# Patient Record
Sex: Male | Born: 1975 | Race: White | Hispanic: No | Marital: Single | Smoking: Current every day smoker
Health system: Southern US, Community
[De-identification: ages and names within clinical notes are randomized; demographics above are authoritative.]

## PROBLEM LIST (undated history)

## (undated) DIAGNOSIS — J189 Pneumonia, unspecified organism: Secondary | ICD-10-CM

## (undated) HISTORY — PX: BRONCHOSCOPIC REMOVAL OF FOREIGN BODY: SHX5171

---

## 2018-04-06 ENCOUNTER — Emergency Department (HOSPITAL_COMMUNITY): Payer: Worker's Compensation

## 2018-04-06 ENCOUNTER — Encounter (HOSPITAL_COMMUNITY): Payer: Self-pay

## 2018-04-06 ENCOUNTER — Encounter (HOSPITAL_COMMUNITY)
Admission: EM | Disposition: A | Discharge status: Home / Self Care | Payer: Self-pay | Source: Home / Self Care | Attending: Orthopaedic Surgery

## 2018-04-06 ENCOUNTER — Emergency Department (HOSPITAL_COMMUNITY): Payer: Worker's Compensation | Admitting: Anesthesiology

## 2018-04-06 ENCOUNTER — Inpatient Hospital Stay (HOSPITAL_COMMUNITY)
Admission: EM | Admit: 2018-04-06 | Discharge: 2018-04-08 | DRG: 494 | Disposition: A | Discharge status: Home / Self Care | Payer: Worker's Compensation | Attending: Orthopaedic Surgery | Admitting: Orthopaedic Surgery

## 2018-04-06 ENCOUNTER — Other Ambulatory Visit: Payer: Self-pay

## 2018-04-06 DIAGNOSIS — Y990 Civilian activity done for income or pay: Secondary | ICD-10-CM | POA: Diagnosis not present

## 2018-04-06 DIAGNOSIS — S82871A Displaced pilon fracture of right tibia, initial encounter for closed fracture: Principal | ICD-10-CM | POA: Diagnosis present

## 2018-04-06 DIAGNOSIS — S82401A Unspecified fracture of shaft of right fibula, initial encounter for closed fracture: Secondary | ICD-10-CM

## 2018-04-06 DIAGNOSIS — Z419 Encounter for procedure for purposes other than remedying health state, unspecified: Secondary | ICD-10-CM

## 2018-04-06 DIAGNOSIS — S89301A Unspecified physeal fracture of lower end of right fibula, initial encounter for closed fracture: Secondary | ICD-10-CM | POA: Diagnosis present

## 2018-04-06 DIAGNOSIS — Y93H3 Activity, building and construction: Secondary | ICD-10-CM

## 2018-04-06 DIAGNOSIS — F17200 Nicotine dependence, unspecified, uncomplicated: Secondary | ICD-10-CM | POA: Diagnosis present

## 2018-04-06 DIAGNOSIS — S82201A Unspecified fracture of shaft of right tibia, initial encounter for closed fracture: Secondary | ICD-10-CM

## 2018-04-06 DIAGNOSIS — S82871S Displaced pilon fracture of right tibia, sequela: Secondary | ICD-10-CM

## 2018-04-06 DIAGNOSIS — T148XXA Other injury of unspecified body region, initial encounter: Secondary | ICD-10-CM

## 2018-04-06 DIAGNOSIS — W12XXXA Fall on and from scaffolding, initial encounter: Secondary | ICD-10-CM | POA: Diagnosis present

## 2018-04-06 DIAGNOSIS — W19XXXA Unspecified fall, initial encounter: Secondary | ICD-10-CM

## 2018-04-06 DIAGNOSIS — Y9269 Other specified industrial and construction area as the place of occurrence of the external cause: Secondary | ICD-10-CM

## 2018-04-06 HISTORY — PX: EXTERNAL FIXATION LEG: SHX1549

## 2018-04-06 LAB — MRSA PCR SCREENING: MRSA by PCR: NEGATIVE

## 2018-04-06 SURGERY — EXTERNAL FIXATION, LOWER EXTREMITY
Anesthesia: General | Laterality: Right

## 2018-04-06 MED ORDER — FENTANYL CITRATE (PF) 100 MCG/2ML IJ SOLN
INTRAMUSCULAR | Status: AC
  Filled 2018-04-06: qty 2

## 2018-04-06 MED ORDER — HYDROMORPHONE HCL 1 MG/ML IJ SOLN
0.5000 mg | INTRAMUSCULAR | Status: DC | PRN
  Administered 2018-04-07 (×3): 1 mg via INTRAVENOUS
  Filled 2018-04-06 (×3): qty 1

## 2018-04-06 MED ORDER — CEFAZOLIN SODIUM-DEXTROSE 1-4 GM/50ML-% IV SOLN
1.0000 g | Freq: Four times a day (QID) | INTRAVENOUS | Status: AC
  Administered 2018-04-07 (×3): 1 g via INTRAVENOUS
  Filled 2018-04-06 (×3): qty 50

## 2018-04-06 MED ORDER — FENTANYL CITRATE (PF) 100 MCG/2ML IJ SOLN
50.0000 ug | Freq: Once | INTRAMUSCULAR | Status: AC
  Administered 2018-04-06: 50 ug via INTRAVENOUS

## 2018-04-06 MED ORDER — SODIUM CHLORIDE 0.9 % IV SOLN
INTRAVENOUS | Status: DC
  Administered 2018-04-06: 23:00:00 via INTRAVENOUS

## 2018-04-06 MED ORDER — PROPOFOL 10 MG/ML IV BOLUS
INTRAVENOUS | Status: DC | PRN
  Administered 2018-04-06: 200 mg via INTRAVENOUS

## 2018-04-06 MED ORDER — FENTANYL CITRATE (PF) 100 MCG/2ML IJ SOLN
INTRAMUSCULAR | Status: DC | PRN
  Administered 2018-04-06: 50 ug via INTRAVENOUS
  Administered 2018-04-06 (×2): 25 ug via INTRAVENOUS

## 2018-04-06 MED ORDER — PROMETHAZINE HCL 25 MG/ML IJ SOLN: 6.2500 mg | INTRAMUSCULAR | Status: DC | PRN

## 2018-04-06 MED ORDER — CEFAZOLIN SODIUM-DEXTROSE 2-4 GM/100ML-% IV SOLN
INTRAVENOUS | Status: AC
  Filled 2018-04-06: qty 100

## 2018-04-06 MED ORDER — ONDANSETRON HCL 4 MG/2ML IJ SOLN
INTRAMUSCULAR | Status: DC | PRN
  Administered 2018-04-06: 4 mg via INTRAVENOUS

## 2018-04-06 MED ORDER — METHOCARBAMOL 500 MG IVPB - SIMPLE MED
500.0000 mg | Freq: Four times a day (QID) | INTRAVENOUS | Status: DC | PRN
  Administered 2018-04-06: 500 mg via INTRAVENOUS
  Filled 2018-04-06: qty 50

## 2018-04-06 MED ORDER — KETAMINE HCL 10 MG/ML IJ SOLN
INTRAMUSCULAR | Status: AC
  Filled 2018-04-06: qty 1

## 2018-04-06 MED ORDER — PANTOPRAZOLE SODIUM 40 MG PO TBEC
40.0000 mg | DELAYED_RELEASE_TABLET | Freq: Every day | ORAL | Status: DC
  Administered 2018-04-07 – 2018-04-08 (×2): 40 mg via ORAL
  Filled 2018-04-06 (×2): qty 1

## 2018-04-06 MED ORDER — ACETAMINOPHEN 325 MG PO TABS
325.0000 mg | ORAL_TABLET | Freq: Four times a day (QID) | ORAL | Status: DC | PRN
  Administered 2018-04-07 – 2018-04-08 (×3): 650 mg via ORAL
  Filled 2018-04-06 (×3): qty 2

## 2018-04-06 MED ORDER — KETOROLAC TROMETHAMINE 15 MG/ML IJ SOLN
15.0000 mg | Freq: Four times a day (QID) | INTRAMUSCULAR | Status: DC
  Administered 2018-04-06 – 2018-04-08 (×7): 15 mg via INTRAVENOUS
  Filled 2018-04-06 (×7): qty 1

## 2018-04-06 MED ORDER — ONDANSETRON HCL 4 MG PO TABS: 4.0000 mg | ORAL_TABLET | Freq: Four times a day (QID) | ORAL | Status: DC | PRN

## 2018-04-06 MED ORDER — MORPHINE SULFATE (PF) 4 MG/ML IV SOLN
4.0000 mg | Freq: Once | INTRAVENOUS | Status: AC
  Administered 2018-04-06: 4 mg via INTRAVENOUS
  Filled 2018-04-06: qty 1

## 2018-04-06 MED ORDER — MIDAZOLAM HCL 2 MG/2ML IJ SOLN
INTRAMUSCULAR | Status: AC
  Filled 2018-04-06: qty 2

## 2018-04-06 MED ORDER — MIDAZOLAM HCL 5 MG/5ML IJ SOLN
INTRAMUSCULAR | Status: DC | PRN
  Administered 2018-04-06: 2 mg via INTRAVENOUS

## 2018-04-06 MED ORDER — OXYCODONE HCL 5 MG PO TABS
5.0000 mg | ORAL_TABLET | ORAL | Status: DC | PRN
  Administered 2018-04-06: 5 mg via ORAL
  Filled 2018-04-06: qty 1

## 2018-04-06 MED ORDER — ONDANSETRON HCL 4 MG/2ML IJ SOLN
4.0000 mg | Freq: Once | INTRAMUSCULAR | Status: DC
  Filled 2018-04-06: qty 2

## 2018-04-06 MED ORDER — OXYCODONE HCL 5 MG PO TABS
10.0000 mg | ORAL_TABLET | ORAL | Status: DC | PRN
  Administered 2018-04-06: 10 mg via ORAL
  Administered 2018-04-07 – 2018-04-08 (×12): 15 mg via ORAL
  Filled 2018-04-06 (×8): qty 3
  Filled 2018-04-06: qty 2
  Filled 2018-04-06 (×4): qty 3

## 2018-04-06 MED ORDER — ONDANSETRON HCL 4 MG/2ML IJ SOLN: 4.0000 mg | Freq: Four times a day (QID) | INTRAMUSCULAR | Status: DC | PRN

## 2018-04-06 MED ORDER — KETAMINE HCL 10 MG/ML IJ SOLN
INTRAMUSCULAR | Status: DC | PRN
  Administered 2018-04-06: 50 mg via INTRAVENOUS

## 2018-04-06 MED ORDER — METOCLOPRAMIDE HCL 5 MG PO TABS: 5.0000 mg | ORAL_TABLET | Freq: Three times a day (TID) | ORAL | Status: DC | PRN

## 2018-04-06 MED ORDER — MEPERIDINE HCL 50 MG/ML IJ SOLN: 6.2500 mg | INTRAMUSCULAR | Status: DC | PRN

## 2018-04-06 MED ORDER — METHOCARBAMOL 500 MG IVPB - SIMPLE MED
INTRAVENOUS | Status: AC
  Filled 2018-04-06: qty 50

## 2018-04-06 MED ORDER — LACTATED RINGERS IV SOLN
INTRAVENOUS | Status: DC | PRN
  Administered 2018-04-06: 20:00:00 via INTRAVENOUS

## 2018-04-06 MED ORDER — FENTANYL CITRATE (PF) 100 MCG/2ML IJ SOLN
50.0000 ug | Freq: Once | INTRAMUSCULAR | Status: DC
  Filled 2018-04-06 (×2): qty 2

## 2018-04-06 MED ORDER — HYDROMORPHONE HCL 1 MG/ML IJ SOLN
INTRAMUSCULAR | Status: AC
  Filled 2018-04-06: qty 1

## 2018-04-06 MED ORDER — METOCLOPRAMIDE HCL 5 MG/ML IJ SOLN: 5.0000 mg | Freq: Three times a day (TID) | INTRAMUSCULAR | Status: DC | PRN

## 2018-04-06 MED ORDER — METHOCARBAMOL 500 MG PO TABS
500.0000 mg | ORAL_TABLET | Freq: Four times a day (QID) | ORAL | Status: DC | PRN
  Administered 2018-04-07 – 2018-04-08 (×5): 500 mg via ORAL
  Filled 2018-04-06 (×5): qty 1

## 2018-04-06 MED ORDER — KETOROLAC TROMETHAMINE 30 MG/ML IJ SOLN
30.0000 mg | Freq: Once | INTRAMUSCULAR | Status: AC | PRN
  Administered 2018-04-06: 30 mg via INTRAVENOUS

## 2018-04-06 MED ORDER — LIDOCAINE 2% (20 MG/ML) 5 ML SYRINGE
INTRAMUSCULAR | Status: DC | PRN
  Administered 2018-04-06: 50 mg via INTRAVENOUS

## 2018-04-06 MED ORDER — KETOROLAC TROMETHAMINE 30 MG/ML IJ SOLN
INTRAMUSCULAR | Status: AC
  Filled 2018-04-06: qty 1

## 2018-04-06 MED ORDER — CEFAZOLIN SODIUM-DEXTROSE 2-3 GM-%(50ML) IV SOLR
INTRAVENOUS | Status: DC | PRN
  Administered 2018-04-06: 2 g via INTRAVENOUS

## 2018-04-06 MED ORDER — DOCUSATE SODIUM 100 MG PO CAPS
100.0000 mg | ORAL_CAPSULE | Freq: Two times a day (BID) | ORAL | Status: DC
  Administered 2018-04-06 – 2018-04-08 (×4): 100 mg via ORAL
  Filled 2018-04-06 (×4): qty 1

## 2018-04-06 MED ORDER — HYDROMORPHONE HCL 1 MG/ML IJ SOLN
0.2500 mg | INTRAMUSCULAR | Status: DC | PRN
  Administered 2018-04-06 (×4): 0.5 mg via INTRAVENOUS

## 2018-04-06 MED ORDER — DIPHENHYDRAMINE HCL 12.5 MG/5ML PO ELIX
12.5000 mg | ORAL_SOLUTION | ORAL | Status: DC | PRN
  Administered 2018-04-07 – 2018-04-08 (×2): 25 mg via ORAL
  Filled 2018-04-06 (×2): qty 10

## 2018-04-06 SURGICAL SUPPLY — 42 items
BAG SPEC THK2 15X12 ZIP CLS (MISCELLANEOUS)
BAG ZIPLOCK 12X15 (MISCELLANEOUS) IMPLANT
BANDAGE ESMARK 6X9 LF (GAUZE/BANDAGES/DRESSINGS) ×1 IMPLANT
BNDG CMPR 9X6 STRL LF SNTH (GAUZE/BANDAGES/DRESSINGS)
BNDG ESMARK 6X9 LF (GAUZE/BANDAGES/DRESSINGS)
BNDG GAUZE ELAST 4 BULKY (GAUZE/BANDAGES/DRESSINGS) ×3 IMPLANT
CAP EXTERNAL F/PIN ×8 IMPLANT
COVER SURGICAL LIGHT HANDLE (MISCELLANEOUS) ×3 IMPLANT
COVER WAND RF STERILE (DRAPES) IMPLANT
CUFF TOURN SGL QUICK 18 (TOURNIQUET CUFF) IMPLANT
CUFF TOURN SGL QUICK 24 (TOURNIQUET CUFF)
CUFF TOURN SGL QUICK 34 (TOURNIQUET CUFF)
CUFF TRNQT CYL 24X4X40X1 (TOURNIQUET CUFF) IMPLANT
CUFF TRNQT CYL 34X4X40X1 (TOURNIQUET CUFF) IMPLANT
DRAIN PENROSE 18X1/2 LTX STRL (DRAIN) ×1 IMPLANT
DRSG PAD ABDOMINAL 8X10 ST (GAUZE/BANDAGES/DRESSINGS) ×2 IMPLANT
DURAPREP 26ML APPLICATOR (WOUND CARE) ×6 IMPLANT
ELECT REM PT RETURN 15FT ADLT (MISCELLANEOUS) ×3 IMPLANT
GAUZE SPONGE 4X4 12PLY STRL (GAUZE/BANDAGES/DRESSINGS) ×1 IMPLANT
GLOVE BIO SURGEON STRL SZ7.5 (GLOVE) ×13 IMPLANT
GLOVE BIOGEL PI IND STRL 8 (GLOVE) ×1 IMPLANT
GLOVE BIOGEL PI INDICATOR 8 (GLOVE) ×2
GLOVE ECLIPSE 8.0 STRL XLNG CF (GLOVE) ×3 IMPLANT
GOWN STRL REUS W/TWL XL LVL3 (GOWN DISPOSABLE) ×7 IMPLANT
HANDPIECE INTERPULSE COAX TIP (DISPOSABLE)
KIT BASIN OR (CUSTOM PROCEDURE TRAY) ×3 IMPLANT
PACK ORTHO EXTREMITY (CUSTOM PROCEDURE TRAY) ×3 IMPLANT
PAD CAST 4YDX4 CTTN HI CHSV (CAST SUPPLIES) ×1 IMPLANT
PADDING CAST COTTON 4X4 STRL (CAST SUPPLIES) ×3
PIN APEX 5.0X200MM (PIN) ×4 IMPLANT
PIN CLAMP 5H 30DEG POST (EXFIX) ×2 IMPLANT
PIN TO ROD COUPLING EXFIX (EXFIX) ×18 IMPLANT
PIN TRANSFIX 615X300 EXFIX (EXFIX) ×2 IMPLANT
PROTECTOR NERVE ULNAR (MISCELLANEOUS) ×3 IMPLANT
ROD CARBON (EXFIX) ×6
ROD CNCT 400X11XNS LF HFMN (EXFIX) ×2 IMPLANT
ROD TIBIA SEMI CRC CVD 11X220 (EXFIX) ×2 IMPLANT
ROD TO ROD COUPLING EXFIX (EXFIX) ×4 IMPLANT
SET HNDPC FAN SPRY TIP SCT (DISPOSABLE) ×1 IMPLANT
SYR CONTROL 10ML LL (SYRINGE) IMPLANT
TOWEL OR 17X26 10 PK STRL BLUE (TOWEL DISPOSABLE) ×4 IMPLANT
stryker trauma ×33 IMPLANT

## 2018-04-06 NOTE — Anesthesia Procedure Notes (Signed)
Procedure Name: LMA Insertion Date/Time: 04/06/2018 8:09 PM Performed by: Paris LoreBlanton, Hargun Spurling M, CRNA Pre-anesthesia Checklist: Patient identified, Emergency Drugs available, Suction available, Patient being monitored and Timeout performed Patient Re-evaluated:Patient Re-evaluated prior to induction Oxygen Delivery Method: Circle system utilized Preoxygenation: Pre-oxygenation with 100% oxygen Induction Type: IV induction Ventilation: Mask ventilation without difficulty LMA: LMA inserted LMA Size: 4.0 Number of attempts: 1 Placement Confirmation: positive ETCO2 and breath sounds checked- equal and bilateral Tube secured with: Tape

## 2018-04-06 NOTE — ED Notes (Signed)
Bed: [id number] Expected date:  Expected time:  Means of arrival:  Comments: EMS-leg injury/fall

## 2018-04-06 NOTE — Transfer of Care (Signed)
Immediate Anesthesia Transfer of Care Note  Patient: Lucas Garrett  Procedure(s) Performed: Procedure(s): EXTERNAL FIXATION RIGHT PILON FRACTURE (Right)  Patient Location: PACU  Anesthesia Type:General  Level of Consciousness:  sedated, patient cooperative and responds to stimulation  Airway & Oxygen Therapy:Patient Spontanous Breathing and Patient connected to face mask oxgen  Post-op Assessment:  Report given to PACU RN and Post -op Vital signs reviewed and stable  Post vital signs:  Reviewed and stable  Last Vitals:  Vitals:   04/06/18 1724 04/06/18 1730  BP: (!) 134/55 (!) 149/85  Pulse: 72 74  Resp: 16   Temp:    SpO2: 97% 96%    Complications: No apparent anesthesia complications

## 2018-04-06 NOTE — H&P (Signed)
Lucas Garrett is an 42 y.o. male.   Chief Complaint: Right lower leg pain and deformity  HPI:  Lucas Garrett is a 42 year old male who had an accidental fall off scaffolding earlier today at approximately 230.  He fell approximately 4.5 feet and landed on his right lower leg.  He denies any loss consciousness dizziness or any other injury.  He has no known medical problems.  Does smoke tobacco.  Last meal was 9 AM. No past medical history on file.   No family history on file. Social History:  has no history on file for tobacco, alcohol, and drug.  Allergies: No Known Allergies    No results found for this or any previous visit (from the past 48 hour(s)). Dg Tibia/fibula Right  Result Date: 04/06/2018 CLINICAL DATA:  Fall, trauma EXAM: RIGHT TIBIA AND FIBULA - 2 VIEW COMPARISON:  None. FINDINGS: artifact about the knee from the overlying sheet is present on AP projection. On lateral projection no evidence of fracture of the proximal tibia. Complex comminuted intra-articular fracture of the distal tibia and fibula is partially imaged. See ankle films. IMPRESSION: 1. No evidence of proximal tibial fracture on suboptimal exam. 2. Complex distal tibiofibular fracture described on ankle film. Electronically Signed   By: Genevive BiStewart  Edmunds M.D.   On: 04/06/2018 17:11   Dg Ankle Complete Right  Result Date: 04/06/2018 CLINICAL DATA:  Larey SeatFell at work, knew he was going to fall so jumped from scaffolding, caught foot, injury to foot/ankle, pain, initial encounter EXAM: RIGHT ANKLE - COMPLETE 3+ VIEW COMPARISON:  None FINDINGS: Osseous mineralization normal for technique. Oblique fracture distal RIGHT fibular diaphysis, minimally displaced laterally with mild apex lateral angulation and minimal overriding. Comminuted distal RIGHT tibial metadiaphyseal fracture with extension into tibiotalar joint. Large displaced posterior articular fragment. Medial displacement of a dominant medial articular fragment. Mild  apex medial and anterior angulation at fracture. No gross dislocation. Tarsals appear intact and normally aligned. IMPRESSION: Oblique displaced and minimally angulated distal RIGHT fibular diaphyseal fracture. Comminuted displaced intra-articular distal RIGHT tibial metadiaphyseal fracture. Electronically Signed   By: Ulyses SouthwardMark  Boles M.D.   On: 04/06/2018 17:12   Dg Foot Complete Right  Result Date: 04/06/2018 CLINICAL DATA:  Larey SeatFell at work, knew he was going to fall so jumped from scaffolding, caught foot, injury to foot/ankle, pain, initial encounter EXAM: RIGHT FOOT COMPLETE - 3+ VIEW COMPARISON:  None FINDINGS: Complex sacral fractures as reported separately. [place] shin normal. Joint spaces within the foot preserved. No additional fracture, dislocation, or bone destruction. IMPRESSION: Complex ankle fractures as reported separately. No additional foot abnormalities. Electronically Signed   By: Ulyses SouthwardMark  Boles M.D.   On: 04/06/2018 17:33    Review of Systems  Constitutional: Negative for chills and fever.  Respiratory: Negative for shortness of breath.   Cardiovascular: Negative for chest pain.  Gastrointestinal: Positive for heartburn.  Musculoskeletal: Positive for myalgias.  Neurological: Negative for dizziness and loss of consciousness.    Blood pressure (!) 149/85, pulse 74, temperature (!) 97.4 F (36.3 C), resp. rate 16, SpO2 96 %. Physical Exam  Constitutional: He is oriented to person, place, and time. He appears well-developed and well-nourished. No distress.  HENT:  Head: Normocephalic and atraumatic.  Cardiovascular: Intact distal pulses.  Respiratory: Effort normal.  Neurological: He is alert and oriented to person, place, and time.  Skin: He is not diaphoretic.  Psychiatric: He has a normal mood and affect. His behavior is normal.  Bilateral lower:  Non  tender bilateral hips.Left lower leg non tender throughout. Calves supple and non tender. Dorsal pedal pulses intact  bilaterally. Gross deformity and edema right ankle. Right foot non tender.No rashes or  lacerations right ankle.   Assessment/Plan: Right pilon fracture : Plan closed reduction and application of external fixator. Will require CT scan to further evaluate Pilon fracture and then at a later date will require definitive surgery.  Discussed with the family and the patient that definitive surgery would be likely somewhere around 2 weeks post injury.  Discussed with patient at length that he needs to stop or at least cut back on smoking, due to the fact that this directly inhibits bone healing and also wound healing. N.p.o. Nonweightbearing right lower extremity   Kellen Hover, PA-C 04/06/2018, 6:23 PM

## 2018-04-06 NOTE — ED Provider Notes (Signed)
[facility]-EMERGENCY DEPT Provider Note   CSN: [id number] Arrival date & time: 04/06/18  1446     History   Chief Complaint Chief Complaint  Patient presents with  . Fall  . Leg Pain    HPI Lucas Garrett is a 42 y.o. otherwise healthy male who is a tobacco user, who presents to the ED with complaints of mechanical fall with R leg/ankle injury.  Pt states he was standing up on some scaffolding about 4.51ft up when he felt himself falling so he decided to jump off the scaffolding and landed on his R leg, which he thinks he broke.  He denies hitting his head or LOC, denies prodromal lightheadedness/dizziness leading to the fall.  He describes his pain as 10/10 constant pressure/tingling in the R leg from the knee down to the foot, worse with movement, and with no tx tried PTA.  This incident occurred just PTA.  He denies sustaining any other injuries during the incident, denies hip/back/neck pain, denies pain in any other extremities, and denies CP, SOB, lightheadedness, head inj/LOC, abd pain, n/v, numbness, focal weakness, or any other complaints at this time.  He has no known allergies and denies having any medical problems. He had a sip of water right after this incident happened, but otherwise has been NPO since 9am. Does not have an orthopedist that he sees.   The history is provided by the patient and medical records. No language interpreter was used.  Fall  Pertinent negatives include no chest pain, no abdominal pain and no shortness of breath.  Leg Pain   Pertinent negatives include no numbness.    No past medical history on file.  There are no active problems to display for this patient.       Home Medications    Prior to Admission medications   Not on File    Family History No family history on file.  Social History Social History   Tobacco Use  . Smoking status: Not on file  Substance Use Topics  . Alcohol use: Not on file  . Drug  use: Not on file     Allergies   Patient has no known allergies.   Review of Systems Review of Systems  HENT: Negative for facial swelling (no head inj).   Respiratory: Negative for shortness of breath.   Cardiovascular: Negative for chest pain.  Gastrointestinal: Negative for abdominal pain, nausea and vomiting.  Musculoskeletal: Positive for arthralgias and myalgias. Negative for back pain and neck pain.  Allergic/Immunologic: Negative for immunocompromised state.  Neurological: Negative for dizziness, syncope, weakness, light-headedness and numbness.  Psychiatric/Behavioral: Negative for confusion.   All other systems reviewed and are negative for acute change except as noted in the HPI.    Physical Exam Updated Vital Signs BP 139/63   Pulse 72   Temp (!) 97.4 F (36.3 C)   Resp 16   SpO2 98%   Physical Exam Vitals signs and nursing note reviewed.  Constitutional:      General: He is in acute distress (in pain).     Appearance: Normal appearance. He is well-developed. He is not toxic-appearing.     Comments: Afebrile, nontoxic, pt in pain, laying on his left side, not wanting to roll onto his right side due to pain in his right leg. Semi-uncooperative with exam  HENT:     Head: Normocephalic and atraumatic.  Eyes:     General:        Right eye:  No discharge.        Left eye: No discharge.     Conjunctiva/sclera: Conjunctivae normal.  Neck:     Musculoskeletal: Normal range of motion and neck supple. Normal range of motion. No neck rigidity, pain with movement, spinous process tenderness or muscular tenderness.     Comments: FROM intact without spinous process TTP, no bony stepoffs or deformities, no paraspinous muscle TTP or muscle spasms. No rigidity. No bruising or swelling.  Cardiovascular:     Rate and Rhythm: Normal rate.  Pulmonary:     Effort: Pulmonary effort is normal. No respiratory distress.  Abdominal:     General: There is no distension.    Musculoskeletal:     Right hip: Normal.     Right knee: He exhibits decreased range of motion (due to pain in lower leg). No tenderness found.     Right ankle: He exhibits decreased range of motion and swelling. He exhibits no laceration and normal pulse. Tenderness.     Lumbar back: Normal.     Right lower leg: He exhibits tenderness, bony tenderness and swelling. He exhibits no laceration.     Comments: C-spine as above, all other spinal levels nonTTP without bony stepoffs or deformities   Pt positioning limits exam slightly, but no pelvic or hip tenderness or gross deformity.   R knee with limited ROM due to pain in lower leg, pt unwilling to perform ROM of this area, however no focal bony or joint line TTP, no bruising or swelling to this area. R lower leg just proximal to the ankle with diffuse TTP and some crepitus, no gross deformity although exam limited due to pt positioning; R ankle with limited ROM due to pain, +swelling and bruising, diffuse TTP to distal lower leg down through ankle and into foot. No break in skin. Achilles unable to be assessed due to pt positioning and uncooperativeness. DP pulse and cap refill of all toes grossly intact. Wiggling toes without significant difficulty. Sensation grossly intact. Soft compartments although this exam is also limited due to pt pain and uncooperativeness  Skin:    General: Skin is warm and dry.     Findings: No rash.  Neurological:     Mental Status: He is alert and oriented to person, place, and time.     Sensory: Sensation is intact. No sensory deficit.     Motor: Motor function is intact.  Psychiatric:        Mood and Affect: Mood and affect normal.        Behavior: Behavior normal.      ED Treatments / Results  Labs (all labs ordered are listed, but only abnormal results are displayed) Labs Reviewed - No data to display  EKG None  Radiology Dg Tibia/fibula Right  Result Date: 04/06/2018 CLINICAL DATA:  Fall, trauma  EXAM: RIGHT TIBIA AND FIBULA - 2 VIEW COMPARISON:  None. FINDINGS: artifact about the knee from the overlying sheet is present on AP projection. On lateral projection no evidence of fracture of the proximal tibia. Complex comminuted intra-articular fracture of the distal tibia and fibula is partially imaged. See ankle films. IMPRESSION: 1. No evidence of proximal tibial fracture on suboptimal exam. 2. Complex distal tibiofibular fracture described on ankle film. Electronically Signed   By: Genevive BiStewart  Edmunds M.D.   On: 04/06/2018 17:11   Dg Ankle Complete Right  Result Date: 04/06/2018 CLINICAL DATA:  Larey SeatFell at work, knew he was going to fall so jumped from scaffolding, caught  foot, injury to foot/ankle, pain, initial encounter EXAM: RIGHT ANKLE - COMPLETE 3+ VIEW COMPARISON:  None FINDINGS: Osseous mineralization normal for technique. Oblique fracture distal RIGHT fibular diaphysis, minimally displaced laterally with mild apex lateral angulation and minimal overriding. Comminuted distal RIGHT tibial metadiaphyseal fracture with extension into tibiotalar joint. Large displaced posterior articular fragment. Medial displacement of a dominant medial articular fragment. Mild apex medial and anterior angulation at fracture. No gross dislocation. Tarsals appear intact and normally aligned. IMPRESSION: Oblique displaced and minimally angulated distal RIGHT fibular diaphyseal fracture. Comminuted displaced intra-articular distal RIGHT tibial metadiaphyseal fracture. Electronically Signed   By: Ulyses Southward M.D.   On: 04/06/2018 17:12   Dg Foot Complete Right  Result Date: 04/06/2018 CLINICAL DATA:  Larey Seat at work, knew he was going to fall so jumped from scaffolding, caught foot, injury to foot/ankle, pain, initial encounter EXAM: RIGHT FOOT COMPLETE - 3+ VIEW COMPARISON:  None FINDINGS: Complex sacral fractures as reported separately. [place] in [place] a shin normal. Joint spaces within the foot preserved. No additional  fracture, dislocation, or bone destruction. IMPRESSION: Complex ankle fractures as reported separately. No additional foot abnormalities. Electronically Signed   By: Ulyses Southward M.D.   On: 04/06/2018 17:33    Procedures Procedures (including critical care time)  Medications Ordered in ED Medications  ondansetron (ZOFRAN) injection 4 mg (4 mg Intravenous Refused 04/06/18 1703)  fentaNYL (SUBLIMAZE) injection 50 mcg (50 mcg Intravenous Given 04/06/18 1540)  morphine 4 MG/ML injection 4 mg (4 mg Intravenous Given 04/06/18 1618)  morphine 4 MG/ML injection 4 mg (4 mg Intravenous Given 04/06/18 1753)     Initial Impression / Assessment and Plan / ED Course  I have reviewed the triage vital signs and the nursing notes.  Pertinent labs & imaging results that were available during my care of the patient were reviewed by me and considered in my medical decision making (see chart for details).     42 y.o. male here with mechanical fall from 4.44ft high scaffolding, states he knew he was going to fall so he jumped, landing on R leg/foot. C/o severe pain to R leg from knee down. On exam, pt semi uncooperative due to pain, stays on his left side refusing to roll on to his back, no neck or back spinal tenderness, no hip or pelvic tenderness, diffuse tenderness to the mid-tib/fib region of the R leg down towards the ankle, swelling and bruising noted above the ankle, crepitus, no definite deformity but difficult to assess due to pt positioning. Distal pulses intact, soft compartments, wiggles toes, sensation grossly intact. Will obtain xrays of R knee/tib-fib/ankle/foot, will give pain meds then reassess shortly. Doubt need for labs for this mechanical fall.   5:25 PM Xrays pending however shows obvious tib/fib rx; pt feeling more numbness/tingling in foot; compartments soft, DP pulse more difficult to find; used doppler and can find pulse however more sluggish compared to contralateral side. Will consult  orthopedist. Discussed case with my current attending Dr. Estell Harpin who agrees with plan.   5:55 PM Tib/fib and ankle xrays showing comminuted displaced intra-articular distal R tibial metadiaphyseal fx, oblique displaced and minimally angulated distal R fibular diaphyseal fracture. Foot xray negative for additional fractures. Spoke with Dr. Magnus Ivan of Ortho, he will come down and assess the pt and advise [place] on further instructions. Will continue to monitor.   6:22 PM Dr. Magnus Ivan down to see pt, informed him that he would be proceeding with surgical fixation of this fracture. Holding orders  to be placed by admitting team. Please see their notes for further documentation of care. I appreciate their help with this pleasant pt's care. Pt stable at time of admission.    Final Clinical Impressions(s) / ED Diagnoses   Final diagnoses:  Closed fracture of right tibia and fibula, initial encounter  Fall, initial encounter    ED Discharge Orders    [address]       Kiandra Sanguinetti, Ben AvonMercedes, [place] JerseyPA-C 04/06/18 Sharyne Peach1822    Zammit, Joseph, MD 04/06/18 [phone]

## 2018-04-06 NOTE — Anesthesia Preprocedure Evaluation (Signed)
Anesthesia Evaluation  Patient identified by MRN, date of birth, ID band Patient awake    Reviewed: Allergy & Precautions, NPO status , Patient's Chart, lab work & pertinent test results  Airway Mallampati: I       Dental  (+) Poor Dentition   Pulmonary    Pulmonary exam normal breath sounds clear to auscultation       Cardiovascular  Rhythm:Regular Rate:Tachycardia     Neuro/Psych negative neurological ROS  negative psych ROS   GI/Hepatic negative GI ROS, Neg liver ROS,   Endo/Other  negative endocrine ROS  Renal/GU negative Renal ROS  negative genitourinary   Musculoskeletal   Abdominal Normal abdominal exam  (+)   Peds  Hematology negative hematology ROS (+)   Anesthesia Other Findings   Reproductive/Obstetrics                             Anesthesia Physical Anesthesia Plan  ASA: II  Anesthesia Plan: General   Post-op Pain Management:    Induction: Intravenous  PONV Risk Score and Plan:   Airway Management Planned: LMA  Additional Equipment:   Intra-op Plan:   Post-operative Plan: Extubation in OR  Informed Consent: I have reviewed the patients History and Physical, chart, labs and discussed the procedure including the risks, benefits and alternatives for the proposed anesthesia with the patient or authorized representative who has indicated his/her understanding and acceptance.   Dental advisory given  Plan Discussed with: CRNA  Anesthesia Plan Comments:         Anesthesia Quick Evaluation

## 2018-04-06 NOTE — Brief Op Note (Signed)
04/06/2018  9:01 PM  PATIENT:  Lucas Garrett  42 y.o. male  PRE-OPERATIVE DIAGNOSIS:  right pilon fracture  POST-OPERATIVE DIAGNOSIS:  right pilon fracture  PROCEDURE:  Procedure(s): EXTERNAL FIXATION RIGHT PILON FRACTURE (Right)  SURGEON:  Surgeon(s) and Role:    Kathryne Hitch* Keta Vanval[place]burgh Y, MD - Primary  PHYSICIAN ASSISTANT:  Rexene EdisonGil Clark, PA-C  ANESTHESIA:   general  EBL:  5 mL   COUNTS:  YES DICTATION: .Other Dictation: Dictation Number [id number]  PLAN OF CARE: Admit to inpatient   PATIENT DISPOSITION:  PACU - hemodynamically stable.   Delay start of Pharmacological VTE agent (>24hrs) due to surgical blood loss or risk of bleeding: no

## 2018-04-06 NOTE — ED Triage Notes (Signed)
While at work, the patient knew he was going to fall so he attempted to jump from the scaffolding instead. During the attempt his foot was caught on it. He suffered injury to it and complains of pain.

## 2018-04-07 ENCOUNTER — Inpatient Hospital Stay (HOSPITAL_COMMUNITY): Payer: Worker's Compensation

## 2018-04-07 MED ORDER — ASPIRIN 325 MG PO TABS
325.0000 mg | ORAL_TABLET | Freq: Two times a day (BID) | ORAL | Status: DC
  Administered 2018-04-07 – 2018-04-08 (×3): 325 mg via ORAL
  Filled 2018-04-07 (×3): qty 1

## 2018-04-07 MED ORDER — GABAPENTIN 300 MG PO CAPS
300.0000 mg | ORAL_CAPSULE | Freq: Three times a day (TID) | ORAL | Status: DC
  Administered 2018-04-08 (×2): 300 mg via ORAL
  Filled 2018-04-07 (×2): qty 1

## 2018-04-07 MED ORDER — GABAPENTIN 100 MG PO CAPS
100.0000 mg | ORAL_CAPSULE | Freq: Three times a day (TID) | ORAL | Status: DC
  Administered 2018-04-07 (×3): 100 mg via ORAL
  Filled 2018-04-07 (×3): qty 1

## 2018-04-07 NOTE — Evaluation (Signed)
Physical Therapy Evaluation Patient Details Name: Lucas Garrett MRN: [id number] DOB: 06-May-1975 Today's Date: 04/07/2018   History of Present Illness  Patient is a 42 y/o male admitted after fall from scaffolding with R pilon fracture now s/p ex-fix.  Clinical Impression  Patient presents with decreased independence with mobility due to pain, limited activity tolerance, decreased balance with NWB and limited knowledge of use of DME.  Feel he will benefit from skilled PT in the acute setting to allow return home with family support.  Likely no initial PT follow up needed.     Follow Up Recommendations Supervision - Intermittent;No PT follow up    Equipment Recommendations  Rolling walker with 5" wheels;Other (comment);3in1 (PT)(crutches)    Recommendations for Other Services       Precautions / Restrictions Precautions Precautions: Fall Restrictions Weight Bearing Restrictions: Yes RLE Weight Bearing: Non weight bearing      Mobility  Bed Mobility Overal bed mobility: Needs Assistance Bed Mobility: Sit to Supine;Supine to Sit     Supine to sit: HOB elevated;Min assist Sit to supine: Supervision   General bed mobility comments: cues and assist initially teaching to manage R LE, to supine with S for safety  Transfers Overall transfer level: Needs assistance Equipment used: Rolling walker (2 wheeled) Transfers: Sit to/from [employer] CorporationStand;Stand Pivot Transfers Sit to Stand: Min assist Stand pivot transfers: Min guard       General transfer comment: cues for hand placement, technique and NWB; up from recliner back to bed to go to CT with RW and assist  Ambulation/Gait Ambulation/Gait assistance: Min assist Gait Distance (Feet): 12 Feet Assistive device: Rolling walker (2 wheeled) Gait Pattern/deviations: Step-to pattern;Decreased stride length;Trunk flexed     General Gait Details: painful and limited with c/o wooziness in sitting/standing, to door and back to chair  only; cues for placement inside walker to avoid hitting ex-fix on side of walker  Stairs            Wheelchair Mobility    Modified Rankin (Stroke Patients Only)       Balance Overall balance assessment: Needs assistance Sitting-balance support: Bilateral upper extremity supported Sitting balance-Leahy Scale: Fair Sitting balance - Comments: UE support more due to pain than balance   Standing balance support: Bilateral upper extremity supported Standing balance-Leahy Scale: Poor Standing balance comment: UE support needed due to NWB R LE                             Pertinent Vitals/Pain Pain Assessment: 0-10 Pain Score: 4  Pain Location: R LE Pain Descriptors / Indicators: Aching Pain Intervention(s): Monitored during session;Repositioned    Home Living Family/patient expects to be discharged to:: Private residence Living Arrangements: Spouse/significant other;Children   Type of Home: House Home Access: Stairs to enter Entrance Stairs-Rails: Right Entrance Stairs-Number of Steps: 5 Home Layout: One level Home Equipment: None      Prior Function Level of Independence: Independent               Hand Dominance        Extremity/Trunk Assessment   Upper Extremity Assessment Upper Extremity Assessment: Overall WFL for tasks assessed    Lower Extremity Assessment Lower Extremity Assessment: RLE deficits/detail RLE Deficits / Details: R lower leg with ex fix, can wiggle toes, but they are somewhat numb, lifts leg on his own, but manipulates with cues for using ex-fix bars for assisting the leg.  Communication   Communication: No difficulties  Cognition Arousal/Alertness: Awake/alert Behavior During Therapy: WFL for tasks assessed/performed Overall Cognitive Status: Within Functional Limits for tasks assessed                                        General Comments      Exercises Other Exercises Other  Exercises: encouraged toe wiggles for circulation   Assessment/Plan    PT Assessment Patient needs continued PT services  PT Problem List Decreased balance;Decreased knowledge of precautions;Pain;Decreased range of motion;Decreased mobility;Decreased activity tolerance       PT Treatment Interventions DME instruction;Functional mobility training;Balance training;Patient/family education;Gait training;Therapeutic activities;Stair training;Therapeutic exercise    PT Goals (Current goals can be found in the Care Plan section)  Acute Rehab PT Goals Patient Stated Goal: to get leg well for return to work PT Goal Formulation: With patient Time For Goal Achievement: 04/14/18 Potential to Achieve Goals: Good    Frequency Min 5X/week   Barriers to discharge        Co-evaluation               AM-PAC PT "6 Clicks" Mobility  Outcome Measure Help needed turning from your back to your side while in a flat bed without using bedrails?: A Little Help needed moving from lying on your back to sitting on the side of a flat bed without using bedrails?: A Little Help needed moving to and from a bed to a chair (including a wheelchair)?: A Little Help needed standing up from a chair using your arms (e.g., wheelchair or bedside chair)?: A Little Help needed to walk in hospital room?: A Little Help needed climbing 3-5 steps with a railing? : A Little 6 Click Score: 18    End of Session Equipment Utilized During Treatment: Gait belt Activity Tolerance: Patient limited by pain Patient left: in bed;with call bell/phone within reach   PT Visit Diagnosis: Difficulty in walking, not elsewhere classified (R26.2);Pain Pain - Right/Left: Right Pain - part of body: Leg;Ankle and joints of foot    Time: 8295-62130933-1002 PT Time Calculation (min) (ACUTE ONLY): 29 min   Charges:   PT Evaluation $PT Eval Moderate Complexity: 1 Mod PT Treatments $Gait Training: 8-22 mins        Lucas Garrett, [place]PT Acute  Rehabilitation Services [phone] 04/07/2018   Lucas Garrett 04/07/2018, 11:38 AM

## 2018-04-07 NOTE — Progress Notes (Signed)
Patient reports increased burning sensation. Required increased PRN pain interventions.  Refuses ice RLE, "makes it hurt worse."   Cap Refill 3 seconds. Continues to have numbness/altered sensation of foot.  Ice provided. Received telephone order from Dr Magnus IvanBlackman to increase Neurontin to 300 mg 3 X daily.   Dr Magnus IvanBlackman will review CT results with patient in AM and review plan of care.

## 2018-04-07 NOTE — Evaluation (Signed)
Occupational Therapy Evaluation Patient Details Name: Lucas Garrett MRN: [id number] DOB: 1976-02-11 Today's Date: 04/07/2018    History of Present Illness Patient is a 42 y/o male admitted after fall from scaffolding with R pilon fracture now s/p ex-fix.   Clinical Impression   Pt admitted with s.p fall with R pilon fx. Pt currently with functional limitations due to the deficits listed below (see OT Problem List).  Pt will benefit from skilled OT to increase their safety and independence with ADL and functional mobility for ADL to facilitate discharge to venue listed below.      Follow Up Recommendations  Home health OT    Equipment Recommendations  3 in 1 bedside commode    Recommendations for Other Services       Precautions / Restrictions Precautions Precautions: Fall Restrictions Weight Bearing Restrictions: Yes RLE Weight Bearing: Non weight bearing      Mobility Bed Mobility Overal bed mobility: Needs Assistance Bed Mobility: Sit to Supine;Supine to Sit     Supine to sit: HOB elevated;Min assist Sit to supine: Supervision      Transfers Overall transfer level: Needs assistance Equipment used: Rolling walker (2 wheeled) Transfers: Sit to/from [employer]Stand;Stand Pivot Transfers Sit to Stand: Min assist Stand pivot transfers: Min guard            Balance Overall balance assessment: Needs assistance Sitting-balance support: Bilateral upper extremity supported Sitting balance-Leahy Scale: Fair Sitting balance - Comments: UE support more due to pain than balance   Standing balance support: Bilateral upper extremity supported Standing balance-Leahy Scale: Poor Standing balance comment: UE support needed due to NWB R LE                           ADL either performed or assessed with clinical judgement   ADL Overall ADL's : Needs assistance/impaired Eating/Feeding: Sitting;Set up   Grooming: Set up;Sitting   Upper Body Bathing: Set  up;Sitting   Lower Body Bathing: Minimal assistance;Sitting/lateral leans;Sit to/from stand   Upper Body Dressing : Set up;Sitting   Lower Body Dressing: Minimal assistance;Sit to/from stand;Sitting/lateral leans   Toilet Transfer: Minimal assistance;BSC;Stand-pivot;RW   Toileting- Clothing Manipulation and Hygiene: Minimal assistance;Sit to/from stand         General ADL Comments: pt seemed frustrated this afternoon regarding situation.  Provided encoruagement                  Pertinent Vitals/Pain Pain Score: 3  Pain Location: R LE Pain Descriptors / Indicators: Aching Pain Intervention(s): Monitored during session;Limited activity within patient's tolerance     Hand Dominance     Extremity/Trunk Assessment     Lower Extremity Assessment RLE Deficits / Details: R lower leg with ex fix, can wiggle toes, but they are somewhat numb, lifts leg on his own, but manipulates with cues for using ex-fix bars for assisting the leg.        Communication Communication Communication: No difficulties   Cognition Arousal/Alertness: Awake/alert Behavior During Therapy: WFL for tasks assessed/performed Overall Cognitive Status: Within Functional Limits for tasks assessed                                                Home Living Family/patient expects to be discharged to:: Private residence Living Arrangements: Spouse/significant other;Children   Type of Home: House  Home Access: Stairs to enter Entrance Stairs-Number of Steps: 5 Entrance Stairs-Rails: Right Home Layout: One level     Bathroom Shower/Tub: Tub/shower unit         Home Equipment: None          Prior Functioning/Environment Level of Independence: Independent                 OT Problem List: Decreased strength;Impaired balance (sitting and/or standing);Decreased activity tolerance;Decreased knowledge of precautions      OT Treatment/Interventions: Self-care/ADL  training;Patient/family education;Therapeutic activities    OT Goals(Current goals can be found in the care plan section) Acute Rehab OT Goals Patient Stated Goal: to get leg well for return to work OT Goal Formulation: With patient Time For Goal Achievement: 04/07/18  OT Frequency: Min 2X/week   Barriers to Garrett/C:            Co-evaluation              AM-PAC OT "6 Clicks" Daily Activity     Outcome Measure Help from another person eating meals?: None Help from another person taking care of personal grooming?: None Help from another person toileting, which includes using toliet, bedpan, or urinal?: A Little Help from another person bathing (including washing, rinsing, drying)?: A Little Help from another person to put on and taking off regular upper body clothing?: None Help from another person to put on and taking off regular lower body clothing?: A Little 6 Click Score: 21   End of Session Equipment Utilized During Treatment: Rolling walker Nurse Communication: Mobility status  Activity Tolerance: Patient tolerated treatment well Patient left: in bed;with call bell/phone within reach  OT Visit Diagnosis: Unsteadiness on feet (R26.81);Other abnormalities of gait and mobility (R26.89);History of falling (Z91.81)                Time: [id number] OT Time Calculation (min): 12 min Charges:  OT General Charges $OT Visit: 1 Visit OT Evaluation $OT Eval Moderate Complexity: 1 Mod  Lucas Garrett, OT Acute Rehabilitation Services [phone] Office- [phone]     Lucas Garrett, Lucas Garrett 04/07/2018, 5:26 PM

## 2018-04-07 NOTE — Progress Notes (Signed)
Subjective: 1 Day Post-Op Procedure(s) (LRB): EXTERNAL FIXATION RIGHT PILON FRACTURE (Right) Patient reports pain as moderate.    Objective: Vital signs in last 24 hours: Temp:  [97.4 F (36.3 C)-98.8 F (37.1 C)] 98.3 F (36.8 C) (12/21 0512) Pulse Rate:  [52-84] 52 (12/21 0512) Resp:  [12-19] 16 (12/21 0512) BP: (102-149)/(55-103) 102/55 (12/21 0512) SpO2:  [93 %-100 %] 100 % (12/21 0512) Weight:  [87 kg] 87 kg (12/20 2218)  Intake/Output from previous day: 12/20 0701 - 12/21 0700 In: 3122.5 [P.O.:1920; I.V.:1102.5; IV Piggyback:100] Out: 2105 [Urine:2100; Blood:5] Intake/Output this shift: No intake/output data recorded.  No results for input(s): HGB in the last 72 hours. No results for input(s): WBC, RBC, HCT, PLT in the last 72 hours. No results for input(s): NA, K, CL, CO2, BUN, CREATININE, GLUCOSE, CALCIUM in the last 72 hours. No results for input(s): LABPT, INR in the last 72 hours.  Sensation intact distally Intact pulses distally Compartment soft   Assessment/Plan: 1 Day Post-Op Procedure(s) (LRB): EXTERNAL FIXATION RIGHT PILON FRACTURE (Right) Up with therapy - non-weight bearing right leg. Will obtain CT scan of right ankle to assess the fracture further. Start Neurontin for burning pain. Case Mgmt for DME needs. Discharge to home tomorrow.    Kathryne HitchChristopher Y Kupono Marling 04/07/2018, 8:00 AM

## 2018-04-07 NOTE — Op Note (Signed)
NAME: Lucas Garrett, [place] M. MEDICAL RECORD [id number] ACCOUNT [id number] DATE OF BIRTH:12/03/1975 [id number] PHYSICIAN:CHRISTOPHER Aretha ParrotY. BLACKMAN, MD  OPERATIVE REPORT  DATE OF PROCEDURE:  04/06/2018  PREOPERATIVE DIAGNOSIS:  Right ankle closed pilon fracture.  POSTOPERATIVE DIAGNOSIS:  Right ankle closed pilon fracture.  PROCEDURE:  External fixation of left ankle pilon fracture.  IMPLANTS:  Stryker external fixation system.  SURGEON:  Vanita PandaChristopher Y. Magnus IvanBlackman, MD  ASSISTANT:  Richardean CanalGilbert Clark, PA-C  ANESTHESIA:  General.  ANTIBIOTICS:  Two grams IV Ancef.  ESTIMATED BLOOD LOSS:  Minimal.  COMPLICATIONS:  None.  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  He had the inability to ambulate and severe pain.  [facility]   X-rays were obtained of his right ankle showing a complex pilon fracture.  There is an intraarticular component as well.  The fibula and distal tibia fractured.  He is neurovascularly intact.  Due to the nature of this injury, he definitely needs  definitive urgent external fixation to take pressure off the soft tissues.  We explained this to him in detail as well as his parents.  He understands the reasoning behind proceeding with surgery.  He also understands that he is at heightened risk for  blood clot.  He also understands that definitive fixation of this fracture will not be for several weeks.  DESCRIPTION OF PROCEDURE:  After informed consent was obtained, appropriate right ankle was marked.  He was brought to the operating room.  General anesthesia was obtained while he was on a stretcher due to severe pain.  He was then placed supine on the  operating table.  A bump was placed under his right hip and his right leg, foot and ankle were prepped and draped with a prescrub followed by DuraPrep.  A  time-out was called.  He was identified as correct patient, correct right leg.  We then placed 2  external fixation pins through 2 small incisions proximal to the zone of the fracture itself placing these Steinmann pins in an anterior to posterior direction confirming the placement under direct fluoroscopy.  We then placed a calcaneus pin from medial  to lateral, again placing it through 2 small incisions and placing it under direct fluoroscopy.  We then constructed our delta frame external fixation and pulled them out at length under direct fluoroscopy, taking pressure off the tissues.  We locked  down the external fixation and confirmed the alignment improved under direct fluoroscopy.  A well-padded sterile dressing was then applied.  He was awakened, extubated, and taken to recovery room in stable condition.  All final counts were correct.  No  complications noted.  Postoperatively, he will be admitted as an inpatient with nonweightbearing on his right lower extremity.  We will need to obtain our CT scan tomorrow to assess the nature of the fracture.  Of note, Richardean CanalGilbert Clark, PA-C's, assistance  was crucial throughout this whole case, especially for pulling traction and helping lock down the external fixation.  TN/NUANCE  D:04/06/2018 T:04/07/2018 JOB:[id number]

## 2018-04-07 NOTE — Plan of Care (Signed)
Plan of care discussed.   

## 2018-04-08 MED ORDER — ASPIRIN 325 MG PO TABS: 325.0000 mg | ORAL_TABLET | Freq: Two times a day (BID) | ORAL | 0 refills | Status: DC

## 2018-04-08 MED ORDER — TIZANIDINE HCL 4 MG PO TABS: 4.0000 mg | ORAL_TABLET | Freq: Three times a day (TID) | ORAL | 0 refills | Status: DC | PRN

## 2018-04-08 MED ORDER — OXYCODONE HCL 5 MG PO TABS: 5.0000 mg | ORAL_TABLET | ORAL | 0 refills | Status: DC | PRN

## 2018-04-08 MED ORDER — GABAPENTIN 300 MG PO CAPS: 300.0000 mg | ORAL_CAPSULE | Freq: Three times a day (TID) | ORAL | 0 refills | Status: DC

## 2018-04-08 NOTE — Discharge Summary (Signed)
Patient ID: SHAWNTA ZIMBELMAN MRN: [id number] DOB/AGE: 42-Mar-1977 42 y.o.  Admit date: 04/06/2018 Discharge date: 04/08/2018  Admission Diagnoses:  Principal Problem:   Closed right pilon fracture, initial encounter   Discharge Diagnoses:  Same  History reviewed. No pertinent past medical history.  Surgeries: Procedure(s): EXTERNAL FIXATION RIGHT PILON FRACTURE on 04/06/2018   Consultants:   Discharged Condition: Improved  Hospital Course: SUNDEEP DESTIN is an 42 y.o. male who was admitted 04/06/2018 for operative treatment ofClosed right pilon fracture, initial encounter. Patient has severe unremitting pain that affects sleep, daily activities, and work/hobbies. After pre-op clearance the patient was taken to the operating room on 04/06/2018 and underwent  Procedure(s): EXTERNAL FIXATION RIGHT PILON FRACTURE.    Patient was given perioperative antibiotics:  Anti-infectives (From admission, onward)   Start     Dose/Rate Route Frequency Ordered Stop   04/07/18 0300  ceFAZolin (ANCEF) IVPB 1 g/50 mL premix     1 g 100 mL/hr over 30 Minutes Intravenous Every 6 hours 04/06/18 2227 04/07/18 1551       Patient was given sequential compression devices, early ambulation, and chemoprophylaxis to prevent DVT.  Patient benefited maximally from hospital stay and there were no complications.    Recent vital signs:  Patient Vitals for the past 24 hrs:  BP Temp Temp src Pulse Resp SpO2  04/08/18 0406 118/69 98.4 F (36.9 C) Oral (!) 51 16 97 %  04/07/18 2144 104/62 97.6 F (36.4 C) Oral (!) 51 16 96 %  04/07/18 1406 120/76 98.2 F (36.8 C) Oral (!) 46 18 98 %     Recent laboratory studies: No results for input(s): WBC, HGB, HCT, PLT, NA, K, CL, CO2, BUN, CREATININE, GLUCOSE, INR, CALCIUM in the last 72 hours.  Invalid input(s): PT, 2   Discharge Medications:   Allergies as of 04/08/2018   No Known Allergies     Medication List    TAKE these medications   aspirin 325  MG tablet Take 1 tablet (325 mg total) by mouth 2 (two) times daily at 10 AM and 5 PM.   gabapentin 300 MG capsule Commonly known as:  NEURONTIN Take 1 capsule (300 mg total) by mouth 3 (three) times daily.   oxyCODONE 5 MG immediate release tablet Commonly known as:  Oxy IR/ROXICODONE Take 1-2 tablets (5-10 mg total) by mouth every 3 (three) hours as needed for moderate pain (pain score 4-6).   tiZANidine 4 MG tablet Commonly known as:  ZANAFLEX Take 1 tablet (4 mg total) by mouth every 8 (eight) hours as needed for muscle spasms.            Durable Medical Equipment  (From admission, onward)         Start     Ordered   04/06/18 2228  DME Walker rolling  Once    Question:  Patient needs a walker to treat with the following condition  Answer:  Closed right pilon fracture, initial encounter   04/06/18 2227          Diagnostic Studies: Dg Tibia/fibula Right  Result Date: 04/06/2018 CLINICAL DATA:  Fall, trauma EXAM: RIGHT TIBIA AND FIBULA - 2 VIEW COMPARISON:  None. FINDINGS: artifact about the knee from the overlying sheet is present on AP projection. On lateral projection no evidence of fracture of the proximal tibia. Complex comminuted intra-articular fracture of the distal tibia and fibula is partially imaged. See ankle films. IMPRESSION: 1. No evidence of proximal tibial fracture on suboptimal exam.  2. Complex distal tibiofibular fracture described on ankle film. Electronically Signed   By: Genevive Bi M.D.   On: 04/06/2018 17:11   Dg Ankle Complete Right  Result Date: 04/06/2018 CLINICAL DATA:  Right ankle fracture EXAM: RIGHT ANKLE - COMPLETE 3+ VIEW COMPARISON:  04/06/2018 FINDINGS: Four low resolution intraoperative spot views of the right ankle. Total fluoroscopy time was 0.5 minutes. Comminuted distal fibular and tibial fractures. Placement of external fixation device. IMPRESSION: Intraoperative fluoroscopic assistance provided during placement of external  fixation device for ankle fracture Electronically Signed   By: Jasmine Pang M.D.   On: 04/06/2018 21:31   Dg Ankle Complete Right  Result Date: 04/06/2018 CLINICAL DATA:  Larey Seat at work, knew he was going to fall so jumped from scaffolding, caught foot, injury to foot/ankle, pain, initial encounter EXAM: RIGHT ANKLE - COMPLETE 3+ VIEW COMPARISON:  None FINDINGS: Osseous mineralization normal for technique. Oblique fracture distal RIGHT fibular diaphysis, minimally displaced laterally with mild apex lateral angulation and minimal overriding. Comminuted distal RIGHT tibial metadiaphyseal fracture with extension into tibiotalar joint. Large displaced posterior articular fragment. Medial displacement of a dominant medial articular fragment. Mild apex medial and anterior angulation at fracture. No gross dislocation. Tarsals appear intact and normally aligned. IMPRESSION: Oblique displaced and minimally angulated distal RIGHT fibular diaphyseal fracture. Comminuted displaced intra-articular distal RIGHT tibial metadiaphyseal fracture. Electronically Signed   By: Ulyses Southward M.D.   On: 04/06/2018 17:12   Ct Ankle Right Wo Contrast  Result Date: 04/07/2018 CLINICAL DATA:  Pilon fracture, status post external fixation. EXAM: CT OF THE RIGHT ANKLE WITHOUT CONTRAST TECHNIQUE: Multidetector CT imaging of the right ankle was performed according to the standard protocol. Multiplanar CT image reconstructions were also generated. COMPARISON:  Radiographs from 04/06/2018 FINDINGS: Bones/Joint/Cartilage Type 3 pilon fracture noted (OTA 43-C3) with extensive comminution of the intersecting fracture planes along the distal tibial articular surface and extensive comminution of the extra-articular components of the distal tibia which extend out 10 cm from the tibial articular surface. Among the distal tibial shaft fragments, a couple of fragments are somewhat imbedded within the fracture planes, including the 2.3 cm fragment  shown medially on image 41/7. The distal tibial fractures also extend into the distal tibiofibular joint centrally and anteriorly, and there is widening of the medial plafond and posteriorly as well as widening of the distance between the fibula and the talus. There multiple small fragments of bone along the articular surface of the distal tibia and a couple of tiny bony fragments loose within the joint. One of the dominant distal tibial fracture planes is distracted by about 9 mm at the joint as shown medially on image 52/8. Mildly comminuted distal fibular shaft fracture observed. No appreciable fractures of the talus, calcaneus, midfoot, or along the Lisfranc joint. The metatarsals are all intact. The toes are not completely included. External fixator noted extending through the posterior calcaneus. Ligaments Suboptimally assessed by CT. Muscles and Tendons There is some partial entrapment of the tibialis posterior and flexor digitorum longus tendons along the distracted posteromedial fracture plane as shown on consecutive images 57 through 79 of series 6. Both tendons partially extend into the region of distraction. Soft tissues As expected there is extensive subcutaneous edema. No obvious bony fragments in the vicinity of the tibial nerve. There are some fracture planes close to the saphenous nerve along the distal tibia and close to the intermediate dorsal cutaneous nerve as well. IMPRESSION: 1. Type 3 pilon fracture (OTA 43-C3) as described  above. Notably, the tibialis posterior and flexor digitorum longus are both partially entrapped along the distracted posteromedial fracture plane of the distal tibia. Electronically Signed   By: Gaylyn RongWalter  Liebkemann M.D.   On: 04/07/2018 12:13   Ct 3d Recon At Scanner  Result Date: 04/07/2018 CLINICAL DATA:  Pilon fracture, status post external fixation. EXAM: CT OF THE RIGHT ANKLE WITHOUT CONTRAST TECHNIQUE: Multidetector CT imaging of the right ankle was performed  according to the standard protocol. Multiplanar CT image reconstructions were also generated. COMPARISON:  Radiographs from 04/06/2018 FINDINGS: Bones/Joint/Cartilage Type 3 pilon fracture noted (OTA 43-C3) with extensive comminution of the intersecting fracture planes along the distal tibial articular surface and extensive comminution of the extra-articular components of the distal tibia which extend out 10 cm from the tibial articular surface. Among the distal tibial shaft fragments, a couple of fragments are somewhat imbedded within the fracture planes, including the 2.3 cm fragment shown medially on image 41/7. The distal tibial fractures also extend into the distal tibiofibular joint centrally and anteriorly, and there is widening of the medial plafond and posteriorly as well as widening of the distance between the fibula and the talus. There multiple small fragments of bone along the articular surface of the distal tibia and a couple of tiny bony fragments loose within the joint. One of the dominant distal tibial fracture planes is distracted by about 9 mm at the joint as shown medially on image 52/8. Mildly comminuted distal fibular shaft fracture observed. No appreciable fractures of the talus, calcaneus, midfoot, or along the Lisfranc joint. The metatarsals are all intact. The toes are not completely included. External fixator noted extending through the posterior calcaneus. Ligaments Suboptimally assessed by CT. Muscles and Tendons There is some partial entrapment of the tibialis posterior and flexor digitorum longus tendons along the distracted posteromedial fracture plane as shown on consecutive images 57 through 79 of series 6. Both tendons partially extend into the region of distraction. Soft tissues As expected there is extensive subcutaneous edema. No obvious bony fragments in the vicinity of the tibial nerve. There are some fracture planes close to the saphenous nerve along the distal tibia and  close to the intermediate dorsal cutaneous nerve as well. IMPRESSION: 1. Type 3 pilon fracture (OTA 43-C3) as described above. Notably, the tibialis posterior and flexor digitorum longus are both partially entrapped along the distracted posteromedial fracture plane of the distal tibia. Electronically Signed   By: Gaylyn RongWalter  Liebkemann M.D.   On: 04/07/2018 12:13   Dg Foot Complete Right  Result Date: 04/06/2018 CLINICAL DATA:  Larey SeatFell at work, knew he was going to fall so jumped from scaffolding, caught foot, injury to foot/ankle, pain, initial encounter EXAM: RIGHT FOOT COMPLETE - 3+ VIEW COMPARISON:  None FINDINGS: Complex sacral fractures as reported separately. [place] shin normal. Joint spaces within the foot preserved. No additional fracture, dislocation, or bone destruction. IMPRESSION: Complex ankle fractures as reported separately. No additional foot abnormalities. Electronically Signed   By: Ulyses SouthwardMark  Boles M.D.   On: 04/06/2018 17:33   Dg C-arm 1-60 Min-no Report  Result Date: 04/06/2018 Fluoroscopy was utilized by the requesting physician.  No radiographic interpretation.    Disposition: Discharge disposition: 01-Home or Self Care         Follow-up Information    Nadara Mustarduda, Marcus V, MD. Schedule an appointment as soon as possible for a visit in 2 week(s).   Specialty:  Orthopedic Surgery Why:  Needs follow-up with Dr. Lajoyce Cornersuda 12/30, 12/31  or 1/2 Contact information: [address] [place] [address] [phone]            Signed: Kathryne HitchChristopher Y Aubreanna Percle 04/08/2018, 8:17 AM

## 2018-04-08 NOTE — Progress Notes (Signed)
Patient discharged to home w/ father. Given all belongings, instructions, prescriptions, equipment. Verbalized understanding of all instructions. Educated in detail pin site care, s/s infection, infection prevention and smoking cessation. Escorted to pov via w/c.

## 2018-04-08 NOTE — Progress Notes (Signed)
Occupational Therapy Treatment Patient Details Name: Lucas Garrett MRN: [id number] DOB: 08-07-1975 Today's Date: 04/08/2018    History of present illness 42 y/o male admitted after fall from scaffolding with R pilon fracture now s/p ex-fix.   OT comments  Pt improved this day.  Plans to go home.  Pt does not want a 3 n 1.  Educated on strategies with ADL activty and NWB.     Follow Up Recommendations  Home health OT    Equipment Recommendations  3 in 1 bedside commode    Recommendations for Other Services      Precautions / Restrictions Precautions Precautions: Fall Restrictions Weight Bearing Restrictions: Yes RLE Weight Bearing: Non weight bearing       Mobility Bed Mobility Overal bed mobility: Modified Independent             General bed mobility comments: sat EOB for a few minutes before standing-pt c/o some lightheadedness  Transfers Overall transfer level: Needs assistance Equipment used: Rolling walker (2 wheeled) Transfers: Sit to/from UGI CorporationStand;Stand Pivot Transfers Sit to Stand: Supervision Stand pivot transfers: Supervision       General transfer comment: for safety. VCs safety, hand placement, adherence to NWB.     Balance Overall balance assessment: Needs assistance         Standing balance support: Bilateral upper extremity supported Standing balance-Leahy Scale: Poor                             ADL either performed or assessed with clinical judgement   ADL Overall ADL's : Needs assistance/impaired         Upper Body Bathing: Set up;Sitting   Lower Body Bathing: Minimal assistance;Sitting/lateral leans;Sit to/from stand   Upper Body Dressing : Set up;Sitting   Lower Body Dressing: Minimal assistance;Sit to/from stand;Sitting/lateral leans   Toilet Transfer: Minimal Holiday representativeassistance;Regular Toilet;Ambulation;Comfort height toilet   Toileting- Clothing Manipulation and Hygiene: Minimal assistance;Sit to/from stand;Cueing  for sequencing;Cueing for safety       Functional mobility during ADLs: Minimal assistance General ADL Comments: pt feeling better.  Does not want a 3 n 1      Vision Patient Visual Report: No change from baseline            Cognition Arousal/Alertness: Awake/alert Behavior During Therapy: WFL for tasks assessed/performed Overall Cognitive Status: Within Functional Limits for tasks assessed                                                General Comments      Pertinent Vitals/ Pain       Pain Assessment: 0-10 Pain Score: 5  Pain Location: R LE Pain Descriptors / Indicators: Aching;Sore Pain Intervention(s): Monitored during session;Repositioned;Ice applied     Prior Functioning/Environment              Frequency  Min 2X/week        Progress Toward Goals  OT Goals(current goals can now be found in the care plan section)  Progress towards OT goals: Progressing toward goals     Plan Discharge plan remains appropriate       AM-PAC OT "6 Clicks" Daily Activity     Outcome Measure   Help from another person eating meals?: None Help from another person taking care of personal grooming?: None  Help from another person toileting, which includes using toliet, bedpan, or urinal?: A Little Help from another person bathing (including washing, rinsing, drying)?: A Little Help from another person to put on and taking off regular upper body clothing?: None Help from another person to put on and taking off regular lower body clothing?: A Little 6 Click Score: 21    End of Session Equipment Utilized During Treatment: Rolling walker  OT Visit Diagnosis: Unsteadiness on feet (R26.81);Other abnormalities of gait and mobility (R26.89);History of falling (Z91.81)   Activity Tolerance Patient tolerated treatment well   Patient Left in bed;with call bell/phone within reach   Nurse Communication Mobility status        Time: 9604-54091145-1211 OT Time  Calculation (min): 26 min  Charges: OT General Charges $OT Visit: 1 Visit OT Treatments $Self Care/Home Management : 8-22 mins  Lise AuerLori Jermell Holeman, OT Acute Rehabilitation Services [phone]- [phone] Office- [phone]      Nichola Cieslinski, Karin GoldenLorraine D 04/08/2018, 12:28 PM

## 2018-04-08 NOTE — Progress Notes (Signed)
Patient ID: Lucas ReeRichard M Berthelot, male   DOB: 1975-08-07, 42 y.o.   MRN: [id number] I spoke to the patient in length about his right ankle injury.  He will need definitive surgery in about two weeks.  The delay is to allow the soft-tissue to lessen with swelling.  I did speak to my partner Dr. Lajoyce Cornersuda, who agrees to see the patient in follow-up due to the complexity of his injury.  He can be discharged to home today.

## 2018-04-08 NOTE — Care Management Note (Signed)
Case Management Note  Patient Details  Name: Lucas ReeRichard M Kedzierski MRN: [id number] Date of Birth: 1975-08-24  Subjective/Objective:   Closed right pilon fracture, on external fixation right pilon fracture 04/06/2018                 Action/Plan:  NCM spoke to pt and he does not have any information on Worker's Comp. Contacted number listed and spoke to his Supervisor, Mr Loreta AveMann. States he does not have the worker's comp claim number. They will not have until Monday, 04/09/2018. Contacted [facility] for RW to be delivered to room. Unit RN will call for Crutches to Orth[facility] to deliver to room. Provided pt with goodrx coupons for [facility] for meds. States he can afford $50. Explained Worker's Comp may reimburse him for cost of meds.     Expected Discharge Date:  04/08/18               Expected Discharge Plan:  Home/Self Care  In-House Referral:  NA  Discharge planning Services  CM Consult  Post Acute Care Choice:  NA Choice offered to:  NA  DME Arranged:  Walker rolling, Crutches DME Agency:  [facility] Other - Comment  HH Arranged:  NA HH Agency:  NA  Status of Service:  Completed, signed off  If discussed at Long Length of Stay Meetings, dates discussed:    Additional Comments:  Elliot CousinShavis, Jurney Overacker Ellen, RN 04/08/2018, 11:09 AM

## 2018-04-08 NOTE — Anesthesia Postprocedure Evaluation (Signed)
Anesthesia Post Note  Patient: Lucas Garrett  Procedure(s) Performed: EXTERNAL FIXATION RIGHT PILON FRACTURE (Right )     Patient location during evaluation: PACU Anesthesia Type: General Level of consciousness: awake and sedated Pain management: pain level controlled Vital Signs Assessment: post-procedure vital signs reviewed and stable Respiratory status: spontaneous breathing Cardiovascular status: stable Postop Assessment: no apparent nausea or vomiting Anesthetic complications: no    Last Vitals:  Vitals:   04/07/18 2144 04/08/18 0406  BP: 104/62 118/69  Pulse: (!) 51 (!) 51  Resp: 16 16  Temp: 36.4 C 36.9 C  SpO2: 96% 97%    Last Pain:  Vitals:   04/08/18 1308  TempSrc:   PainSc: 7    Pain Goal: Patients Stated Pain Goal: 4 (04/08/18 1308)               Lucas MacadamJohn F Miho Monda Jr

## 2018-04-08 NOTE — Discharge Instructions (Signed)
No weight on your right leg at all. Expect swelling - ice and elevation several times daily. You can get you external fixator wet in the shower daily. You can clean your pin sites daily with a small Q-Tip dipped in peroxide. You can put neosporin ointment around the pin sites daily and new gauze. Expect some drainage from the pin sites.

## 2018-04-08 NOTE — Progress Notes (Signed)
Physical Therapy Treatment Patient Details Name: Lucas ReeRichard M Garrett MRN: [id number] DOB: 09-Jan-1976 Today's Date: 04/08/2018    History of Present Illness 42 y/o male admitted after fall from scaffolding with R pilon fracture now s/p ex-fix.    PT Comments    Pt is progressing with mobility. Reviewed/practiced gait training and stair training. Reinforced NWB precaution. All education completed. Okay to d/c from PT standpoint.    Follow Up Recommendations  No PT follow up;Supervision - Intermittent;Supervision for mobility/OOB     Equipment Recommendations  Crutches;Rolling walker with 5" wheels;3in1 (PT)    Recommendations for Other Services       Precautions / Restrictions Precautions Precautions: Fall Restrictions Weight Bearing Restrictions: Yes RLE Weight Bearing: Non weight bearing    Mobility  Bed Mobility Overal bed mobility: Modified Independent             General bed mobility comments: sat EOB for a few minutes before standing-pt c/o some lightheadedness  Transfers Overall transfer level: Needs assistance Equipment used: Rolling walker (2 wheeled) Transfers: Sit to/from Stand Sit to Stand: Supervision         General transfer comment: for safety. VCs safety, hand placement, adherence to NWB.   Ambulation/Gait Ambulation/Gait assistance: Min guard Gait Distance (Feet): 15 Feet Assistive device: Rolling walker (2 wheeled) Gait Pattern/deviations: Step-to pattern     General Gait Details: Close guard for safety. VCs safety, adherence to NWB status. Pt c/o some ligtheadedness and pain but he was able to continue.    Stairs Stairs: Yes Stairs assistance: Min assist Stair Management: Step to pattern;One rail Right;With crutches;Forwards Number of Stairs: 2 General stair comments: Up and over portable steps with 1 crutch, 1 rail. VCS safety, technique, sequence. Assist to steady.    Wheelchair Mobility    Modified Rankin (Stroke Patients  Only)       Balance Overall balance assessment: Needs assistance         Standing balance support: Bilateral upper extremity supported Standing balance-Leahy Scale: Poor                              Cognition Arousal/Alertness: Awake/alert Behavior During Therapy: WFL for tasks assessed/performed Overall Cognitive Status: Within Functional Limits for tasks assessed                                        Exercises      General Comments        Pertinent Vitals/Pain Pain Assessment: 0-10 Pain Score: 5  Pain Location: R LE Pain Descriptors / Indicators: Aching;Sore Pain Intervention(s): Monitored during session;Repositioned;Ice applied    Home Living                      Prior Function            PT Goals (current goals can now be found in the care plan section) Progress towards PT goals: Progressing toward goals    Frequency    Min 5X/week      PT Plan Current plan remains appropriate    Co-evaluation              AM-PAC PT "6 Clicks" Mobility   Outcome Measure  Help needed turning from your back to your side while in a flat bed without using bedrails?: A Little Help needed moving from  lying on your back to sitting on the side of a flat bed without using bedrails?: A Little Help needed moving to and from a bed to a chair (including a wheelchair)?: A Little Help needed standing up from a chair using your arms (e.g., wheelchair or bedside chair)?: A Little Help needed to walk in hospital room?: A Little Help needed climbing 3-5 steps with a railing? : A Little 6 Click Score: 18    End of Session Equipment Utilized During Treatment: Gait belt Activity Tolerance: Patient tolerated treatment well Patient left: in bed;with call bell/phone within reach   PT Visit Diagnosis: Difficulty in walking, not elsewhere classified (R26.2);Pain Pain - Right/Left: Right Pain - part of body: Leg     Time: [id number] PT  Time Calculation (min) (ACUTE ONLY): 9 min  Charges:  $Gait Training: 8-22 mins                        Rebeca AlertJannie Solomia Harrell, PT Acute Rehabilitation Services Pager: [phone] Office: [phone]

## 2018-04-12 ENCOUNTER — Telehealth (INDEPENDENT_AMBULATORY_CARE_PROVIDER_SITE_OTHER): Payer: Self-pay | Admitting: Orthopedic Surgery

## 2018-04-12 NOTE — Telephone Encounter (Signed)
Lucas Garrett called in to discuss his foot.  Patient is s/p pilon fracture and scheduled to see Dr. Lajoyce Cornersuda on 04/16/2018.  He advised me that his foot was so swollen that it had "split open".   Asked Lucas Garrett to come in this afternoon to let us take a look at this.  He stated that he did not have a ride, but could come tomorrow am.  I made appointment for Lucas AdamShawn, PA-C to see him Friday 04/13/2018 at 10:30am.  He was hesitant to come in earlier than his scheduled appointment for Monday 04/16/18, but I asked a few questions and it sounds like he has skin breakdown and the area is warm to the touch and red per patient.  Advised that there may be some infection that needs to be addressed ASAP.

## 2018-04-13 ENCOUNTER — Encounter (INDEPENDENT_AMBULATORY_CARE_PROVIDER_SITE_OTHER): Payer: Self-pay | Admitting: Physician Assistant

## 2018-04-13 ENCOUNTER — Ambulatory Visit (INDEPENDENT_AMBULATORY_CARE_PROVIDER_SITE_OTHER): Payer: Worker's Compensation | Admitting: Physician Assistant

## 2018-04-13 DIAGNOSIS — S82874D Nondisplaced pilon fracture of right tibia, subsequent encounter for closed fracture with routine healing: Secondary | ICD-10-CM

## 2018-04-13 NOTE — Progress Notes (Signed)
Office Visit Note   Patient: Lucas Garrett           Date of Birth: [birth year]           MRN: [id number] Visit Date: 04/13/2018              Requested by: No referring provider defined for this encounter. PCP: Patient, No Pcp Per  Chief Complaint  Patient presents with  . Right Ankle - Routine Post Op      HPI: The patient is a 42 yo gentleman who is seen for post operative follow up following a right ankle pilon fracture. He is S/P external fixation on 04/06/18 per Dr. Magnus IvanBlackman. He has been non weight bearing and trying to elevate as much as possible. He noticed some blisters over the ankle and comes in for evaluation. He reports the area was red and warm yesterday, but seems much better today. Some of the blisters have ruptured.    Assessment & Plan: Visit Diagnoses:  1. Closed nondisplaced pilon fracture of right tibia with routine healing, subsequent encounter     Plan: Dr. Roda ShuttersXu evaluated the blisters and recommends xeroform after rupturing and daily dressing change with xeroform until re-evaluation on 04/16/18 by Dr. Lajoyce Cornersuda. Counseled the patient and mom that there are no signs of infection currently. No indication for antibiotics. Recommended continued elevation, non weight bearing okay to clean pin sites with H2O2 . Follow up Monday, 04/16/18 with Dr. Lajoyce Cornersuda for planning of definitive fixation.   Follow-Up Instructions: Return in about 3 days (around 04/16/2018).   Ortho Exam  Patient is alert, oriented, no adenopathy, well-dressed, normal affect, normal respiratory effort. The right ankle external fixation is in place. Pin sites without signs of infection. Minimal bloody crusting about pins. Anterior ankle with blistering, partially ruptured and the rest of the blisters were ruptured and drained . Xeroform was applied to the site. Wiggles toes and sensation intact over toes. Good dorsalis pedis pulses.   Imaging: No results found. No images are attached to the  encounter.  Labs: No results found for: HGBA1C, ESRSEDRATE, CRP, LABURIC, REPTSTATUS, GRAMSTAIN, CULT, LABORGA   No results found for: ALBUMIN, PREALBUMIN, LABURIC  There is no height or weight on file to calculate BMI.  Orders:  No orders of the defined types were placed in this encounter.  No orders of the defined types were placed in this encounter.    Procedures: No procedures performed  Clinical Data: No additional findings.  ROS:  All other systems negative, except as noted in the HPI. Review of Systems  Objective: Vital Signs: There were no vitals taken for this visit.  Specialty Comments:  No specialty comments available.  PMFS History: Patient Active Problem List   Diagnosis Date Noted  . Closed right pilon fracture, initial encounter 04/06/2018   History reviewed. No pertinent past medical history.  History reviewed. No pertinent family history.  History reviewed. No pertinent surgical history. Social History   Occupational History  . 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 Smoking status: Current Every Day Smoker    Packs/day: 1.00    Years: 27.00    Pack years: 27.00    Types: Cigarettes    Start date: 04/07/1991  . Smokeless tobacco: Never Used  . Tobacco comment: "I want to" Quit  Substance and Sexual Activity  . Alcohol use: Yes    Alcohol/week: 10.0 standard drinks    Types: 10 Cans  of beer per week    Comment: 5, 2Xweek  . Drug use: Never  . Sexual activity: Yes    Partners: Female

## 2018-04-16 ENCOUNTER — Ambulatory Visit (INDEPENDENT_AMBULATORY_CARE_PROVIDER_SITE_OTHER): Payer: Worker's Compensation | Admitting: Orthopedic Surgery

## 2018-04-16 ENCOUNTER — Encounter (INDEPENDENT_AMBULATORY_CARE_PROVIDER_SITE_OTHER): Payer: Self-pay | Admitting: Orthopedic Surgery

## 2018-04-16 ENCOUNTER — Telehealth (INDEPENDENT_AMBULATORY_CARE_PROVIDER_SITE_OTHER): Payer: Self-pay

## 2018-04-16 VITALS — Ht 76.0 in | Wt 191.8 lb

## 2018-04-16 DIAGNOSIS — S82871S Displaced pilon fracture of right tibia, sequela: Secondary | ICD-10-CM

## 2018-04-16 MED ORDER — MUPIROCIN 2 % EX OINT: 1.0000 "application " | TOPICAL_OINTMENT | Freq: Every day | CUTANEOUS | 0 refills | Status: DC

## 2018-04-16 NOTE — Progress Notes (Signed)
Office Visit Note   Patient: Lucas Garrett           Date of Birth: 07-02-1975           MRN: [id number] Visit Date: 04/16/2018              Requested by: No referring provider defined for this encounter. PCP: Patient, No Pcp Per  Chief Complaint  Patient presents with  . Right Leg - Routine Post Op, Pain      HPI: Patient is a 42 year old gentleman status post closed displaced pilon fracture on the right.  Patient was initially seen by Dr. Magnus IvanBlackman was placed in external fixator a CT scan was obtained and patient is seen today for initial evaluation and treatment.  Patient states he has difficulty sleeping complains of swelling.  He has been using Neurontin Zanaflex and Percocet for pain.  Assessment & Plan: Visit Diagnoses:  1. Pilon fracture of right tibia, sequela     Plan: Discussed the importance of open reduction internal fixation to better align the joint.  Discussed that with the multiple fragments we would realign the larger fragments remove the loose fragments that are nonviable continue the external fixator to take stress off the repair.  Discussed the risks of infection neurovascular injury nonhealing of the skin nonhealing of the bone need for fusion surgery.  Also discussed that if patient does not completely stop smoking he is an increased risk of the wound dehiscence and an increased risk for transtibial amputation.  Patient states he understands wished to proceed at this time.  Discussed the importance of pin tract care with half-strength hydrogen peroxide and pin tract care with Bactroban twice a day patient will also use Bactroban over the superficial epithelialization of the dorsal ankle fracture blister.  Again reinforced the importance of elevation to decrease swelling.  Follow-Up Instructions: Return in about 2 weeks (around 04/30/2018).   Ortho Exam  Patient is alert, oriented, no adenopathy, well-dressed, normal affect, normal respiratory  effort. Examination patient does have clean pin tracks.  He has a healing fracture blister over the dorsum of his ankle fracture blisters approximately 2 x 3 cm there is superficial epithelialization there is no cellulitis.  Patient has good hair growth into his foot he has a palpable dorsalis pedis and posterior tibial pulse.  Patient states that he is decreasing the amount he is smoking discussed the importance of complete  stopping smoking the importance of not using any nicotine products and the importance of not vaping.  Review of the CT scan shows significant comminution of the tibial talar joint with a large fragment medially.  There is a Weber C fibular fracture however the syndesmosis is intact.  Imaging: No results found. No images are attached to the encounter.  Labs: No results found for: HGBA1C, ESRSEDRATE, CRP, LABURIC, REPTSTATUS, GRAMSTAIN, CULT, LABORGA   No results found for: ALBUMIN, PREALBUMIN, LABURIC  Body mass index is 23.35 kg/m.  Orders:  No orders of the defined types were placed in this encounter.  Meds ordered this encounter  Medications  . mupirocin ointment (BACTROBAN) 2 %    Sig: Apply 1 application topically daily.    Dispense:  22 g    Refill:  0    Apply to blisters and pin sites daily     Procedures: No procedures performed  Clinical Data: No additional findings.  ROS:  All other systems negative, except as noted in the HPI. Review of Systems  Objective: Vital  Signs: Ht 6\' 4"  (1.93 m)   Wt 191 lb 12.8 oz (87 kg)   BMI 23.35 kg/m   Specialty Comments:  No specialty comments available.  PMFS History: Patient Active Problem List   Diagnosis Date Noted  . Closed right pilon fracture, initial encounter 04/06/2018   History reviewed. No pertinent past medical history.  History reviewed. No pertinent family history.  History reviewed. No pertinent surgical history. Social History   Occupational History  . 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 Smoking status: Current Every Day Smoker    Packs/day: 1.00    Years: 27.00    Pack years: 27.00    Types: Cigarettes    Start date: 04/07/1991  . Smokeless tobacco: Never Used  . Tobacco comment: "I want to" Quit  Substance and Sexual Activity  . Alcohol use: Yes    Alcohol/week: 10.0 standard drinks    Types: 10 Cans of beer per week    Comment: 5, 2Xweek  . Drug use: Never  . Sexual activity: Yes    Partners: Female

## 2018-04-16 NOTE — Telephone Encounter (Signed)
Work comp Leisure centre manageradjustor is [employer] ph # is [phone] Fax#[phone] GNFA[employer]#[id number]#[id number]  She is out of the office until 04/23/18 so I spoke with Charolett BumpersJeff Cain @ [phone] who approved the ORIF procedure that is scheduled for 04/20/18. Faxed todays office note, 04/13/18 note, and the 04/06/18 op note to his attn @ [phone].

## 2018-04-17 ENCOUNTER — Encounter (HOSPITAL_COMMUNITY): Payer: Self-pay | Admitting: Orthopaedic Surgery

## 2018-04-19 ENCOUNTER — Ambulatory Visit (INDEPENDENT_AMBULATORY_CARE_PROVIDER_SITE_OTHER): Payer: Self-pay | Admitting: Physician Assistant

## 2018-04-19 ENCOUNTER — Encounter (HOSPITAL_COMMUNITY): Payer: Self-pay | Admitting: *Deleted

## 2018-04-19 NOTE — Anesthesia Preprocedure Evaluation (Addendum)
Anesthesia Evaluation  Patient identified by MRN, date of birth, ID band Patient awake    Reviewed: Allergy & Precautions, NPO status , Patient's Chart, lab work & pertinent test results  History of Anesthesia Complications Negative for: history of anesthetic complications  Airway Mallampati: II  TM Distance: >3 FB Neck ROM: Full    Dental  (+) Teeth Intact, Dental Advisory Given, Chipped,    Pulmonary Current Smoker,    Pulmonary exam normal breath sounds clear to auscultation       Cardiovascular negative cardio ROS Normal cardiovascular exam Rhythm:Regular Rate:Normal     Neuro/Psych negative neurological ROS     GI/Hepatic negative GI ROS, Neg liver ROS,   Endo/Other  negative endocrine ROS  Renal/GU negative Renal ROS     Musculoskeletal negative musculoskeletal ROS (+)   Abdominal   Peds  Hematology negative hematology ROS (+)   Anesthesia Other Findings Day of surgery medications reviewed with the patient.  Reproductive/Obstetrics                            Anesthesia Physical Anesthesia Plan  ASA: II  Anesthesia Plan: General   Post-op Pain Management: GA combined w/ Regional for post-op pain   Induction: Intravenous  PONV Risk Score and Plan: 2 and Treatment may vary due to age or medical condition, Ondansetron, Dexamethasone and Midazolam  Airway Management Planned: LMA  Additional Equipment:   Intra-op Plan:   Post-operative Plan: Extubation in OR  Informed Consent: I have reviewed the patients History and Physical, chart, labs and discussed the procedure including the risks, benefits and alternatives for the proposed anesthesia with the patient or authorized representative who has indicated his/her understanding and acceptance.   Dental advisory given  Plan Discussed with: CRNA  Anesthesia Plan Comments:        Anesthesia Quick Evaluation

## 2018-04-20 ENCOUNTER — Inpatient Hospital Stay (HOSPITAL_COMMUNITY)
Admission: RE | Admit: 2018-04-20 | Discharge: 2018-04-22 | DRG: 494 | Disposition: A | Discharge status: Home / Self Care | Payer: Worker's Compensation | Attending: Orthopedic Surgery | Admitting: Orthopedic Surgery

## 2018-04-20 ENCOUNTER — Encounter (HOSPITAL_COMMUNITY)
Admission: RE | Disposition: A | Discharge status: Home / Self Care | Payer: Self-pay | Source: Home / Self Care | Attending: Orthopedic Surgery

## 2018-04-20 ENCOUNTER — Inpatient Hospital Stay (HOSPITAL_COMMUNITY): Payer: Worker's Compensation | Admitting: Anesthesiology

## 2018-04-20 ENCOUNTER — Encounter (HOSPITAL_COMMUNITY): Payer: Self-pay

## 2018-04-20 ENCOUNTER — Other Ambulatory Visit: Payer: Self-pay

## 2018-04-20 DIAGNOSIS — S82871A Displaced pilon fracture of right tibia, initial encounter for closed fracture: Principal | ICD-10-CM | POA: Diagnosis present

## 2018-04-20 DIAGNOSIS — S82871S Displaced pilon fracture of right tibia, sequela: Secondary | ICD-10-CM

## 2018-04-20 DIAGNOSIS — G479 Sleep disorder, unspecified: Secondary | ICD-10-CM | POA: Diagnosis present

## 2018-04-20 DIAGNOSIS — Z79899 Other long term (current) drug therapy: Secondary | ICD-10-CM

## 2018-04-20 DIAGNOSIS — Z87891 Personal history of nicotine dependence: Secondary | ICD-10-CM

## 2018-04-20 DIAGNOSIS — Z791 Long term (current) use of non-steroidal anti-inflammatories (NSAID): Secondary | ICD-10-CM

## 2018-04-20 DIAGNOSIS — Z79891 Long term (current) use of opiate analgesic: Secondary | ICD-10-CM | POA: Diagnosis not present

## 2018-04-20 DIAGNOSIS — W1789XA Other fall from one level to another, initial encounter: Secondary | ICD-10-CM | POA: Diagnosis present

## 2018-04-20 DIAGNOSIS — Z7982 Long term (current) use of aspirin: Secondary | ICD-10-CM | POA: Diagnosis not present

## 2018-04-20 HISTORY — PX: ORIF ANKLE FRACTURE: SHX5408

## 2018-04-20 HISTORY — DX: Pneumonia, unspecified organism: J18.9

## 2018-04-20 LAB — CBC
HCT: 38.8 % — ABNORMAL LOW (ref 39.0–52.0)
Hemoglobin: 12.6 g/dL — ABNORMAL LOW (ref 13.0–17.0)
MCH: 30.9 pg (ref 26.0–34.0)
MCHC: 32.5 g/dL (ref 30.0–36.0)
MCV: 95.1 fL (ref 80.0–100.0)
Platelets: 264 10*3/uL (ref 150–400)
RBC: 4.08 MIL/uL — ABNORMAL LOW (ref 4.22–5.81)
RDW: 11 % — ABNORMAL LOW (ref 11.5–15.5)
WBC: 5.8 10*3/uL (ref 4.0–10.5)
nRBC: 0 % (ref 0.0–0.2)

## 2018-04-20 LAB — COMPREHENSIVE METABOLIC PANEL
ALT: 18 U/L (ref 0–44)
AST: 18 U/L (ref 15–41)
Albumin: 3.3 g/dL — ABNORMAL LOW (ref 3.5–5.0)
Alkaline Phosphatase: 49 U/L (ref 38–126)
Anion gap: 6 (ref 5–15)
BUN: 17 mg/dL (ref 6–20)
CO2: 28 mmol/L (ref 22–32)
Calcium: 9 mg/dL (ref 8.9–10.3)
Chloride: 104 mmol/L (ref 98–111)
Creatinine, Ser: 1.17 mg/dL (ref 0.61–1.24)
GFR calc Af Amer: >60 mL/min (ref >60)
GFR calc non Af Amer: >60 mL/min (ref >60)
Glucose, Bld: 93 mg/dL (ref 70–99)
POTASSIUM: 4.1 mmol/L (ref 3.5–5.1)
Sodium: 138 mmol/L (ref 135–145)
Total Bilirubin: 0.2 mg/dL — ABNORMAL LOW (ref 0.3–1.2)
Total Protein: 5.8 g/dL — ABNORMAL LOW (ref 6.5–8.1)

## 2018-04-20 SURGERY — OPEN REDUCTION INTERNAL FIXATION (ORIF) ANKLE FRACTURE
Anesthesia: General | Laterality: Right

## 2018-04-20 MED ORDER — MIDAZOLAM HCL 2 MG/2ML IJ SOLN
INTRAMUSCULAR | Status: AC
  Filled 2018-04-20: qty 2

## 2018-04-20 MED ORDER — DEXAMETHASONE SODIUM PHOSPHATE 10 MG/ML IJ SOLN
INTRAMUSCULAR | Status: DC | PRN
  Administered 2018-04-20: 10 mg via INTRAVENOUS

## 2018-04-20 MED ORDER — CEFAZOLIN SODIUM-DEXTROSE 2-4 GM/100ML-% IV SOLN
2.0000 g | INTRAVENOUS | Status: AC
  Administered 2018-04-20: 2 g via INTRAVENOUS
  Filled 2018-04-20: qty 100

## 2018-04-20 MED ORDER — LIDOCAINE 2% (20 MG/ML) 5 ML SYRINGE
INTRAMUSCULAR | Status: DC | PRN
  Administered 2018-04-20: 80 mg via INTRAVENOUS

## 2018-04-20 MED ORDER — MAGNESIUM CITRATE PO SOLN: 1.0000 | Freq: Once | ORAL | Status: DC | PRN

## 2018-04-20 MED ORDER — OXYCODONE HCL 5 MG PO TABS
5.0000 mg | ORAL_TABLET | ORAL | Status: DC | PRN
  Filled 2018-04-20 (×2): qty 2

## 2018-04-20 MED ORDER — FENTANYL CITRATE (PF) 100 MCG/2ML IJ SOLN: 25.0000 ug | INTRAMUSCULAR | Status: DC | PRN

## 2018-04-20 MED ORDER — LIDOCAINE 2% (20 MG/ML) 5 ML SYRINGE
INTRAMUSCULAR | Status: AC
  Filled 2018-04-20: qty 5

## 2018-04-20 MED ORDER — POLYETHYLENE GLYCOL 3350 17 G PO PACK: 17.0000 g | PACK | Freq: Every day | ORAL | Status: DC | PRN

## 2018-04-20 MED ORDER — ONDANSETRON HCL 4 MG/2ML IJ SOLN
INTRAMUSCULAR | Status: DC | PRN
  Administered 2018-04-20: 4 mg via INTRAVENOUS

## 2018-04-20 MED ORDER — BUPIVACAINE-EPINEPHRINE (PF) 0.5% -1:200000 IJ SOLN
INTRAMUSCULAR | Status: DC | PRN
  Administered 2018-04-20: 20 mL via PERINEURAL
  Administered 2018-04-20: 10 mL via PERINEURAL

## 2018-04-20 MED ORDER — MUPIROCIN 2 % EX OINT
1.0000 "application " | TOPICAL_OINTMENT | Freq: Every day | CUTANEOUS | Status: DC
  Administered 2018-04-21 – 2018-04-22 (×2): 1 via TOPICAL
  Filled 2018-04-20: qty 22

## 2018-04-20 MED ORDER — PROMETHAZINE HCL 25 MG/ML IJ SOLN: 6.2500 mg | INTRAMUSCULAR | Status: DC | PRN

## 2018-04-20 MED ORDER — SODIUM CHLORIDE 0.9 % IV SOLN: INTRAVENOUS | Status: DC

## 2018-04-20 MED ORDER — LACTATED RINGERS IV SOLN
INTRAVENOUS | Status: DC | PRN
  Administered 2018-04-20 (×2): via INTRAVENOUS

## 2018-04-20 MED ORDER — PROPOFOL 10 MG/ML IV BOLUS
INTRAVENOUS | Status: AC
  Filled 2018-04-20: qty 20

## 2018-04-20 MED ORDER — OXYCODONE HCL 5 MG/5ML PO SOLN: 5.0000 mg | Freq: Once | ORAL | Status: DC | PRN

## 2018-04-20 MED ORDER — BISACODYL 10 MG RE SUPP: 10.0000 mg | Freq: Every day | RECTAL | Status: DC | PRN

## 2018-04-20 MED ORDER — METOCLOPRAMIDE HCL 5 MG PO TABS: 5.0000 mg | ORAL_TABLET | Freq: Three times a day (TID) | ORAL | Status: DC | PRN

## 2018-04-20 MED ORDER — DOCUSATE SODIUM 100 MG PO CAPS
100.0000 mg | ORAL_CAPSULE | Freq: Two times a day (BID) | ORAL | Status: DC
  Administered 2018-04-20 – 2018-04-22 (×4): 100 mg via ORAL
  Filled 2018-04-20 (×4): qty 1

## 2018-04-20 MED ORDER — EPHEDRINE SULFATE-NACL 50-0.9 MG/10ML-% IV SOSY
PREFILLED_SYRINGE | INTRAVENOUS | Status: DC | PRN
  Administered 2018-04-20 (×3): 5 mg via INTRAVENOUS

## 2018-04-20 MED ORDER — HYDROMORPHONE HCL 1 MG/ML IJ SOLN
0.5000 mg | INTRAMUSCULAR | Status: DC | PRN
  Administered 2018-04-21 – 2018-04-22 (×4): 1 mg via INTRAVENOUS
  Filled 2018-04-20 (×6): qty 1

## 2018-04-20 MED ORDER — GABAPENTIN 300 MG PO CAPS
300.0000 mg | ORAL_CAPSULE | Freq: Three times a day (TID) | ORAL | Status: DC
  Administered 2018-04-20 – 2018-04-22 (×7): 300 mg via ORAL
  Filled 2018-04-20 (×7): qty 1

## 2018-04-20 MED ORDER — ONDANSETRON HCL 4 MG/2ML IJ SOLN: 4.0000 mg | Freq: Four times a day (QID) | INTRAMUSCULAR | Status: DC | PRN

## 2018-04-20 MED ORDER — ASPIRIN 325 MG PO TABS
325.0000 mg | ORAL_TABLET | Freq: Two times a day (BID) | ORAL | Status: DC
  Administered 2018-04-20 – 2018-04-22 (×5): 325 mg via ORAL
  Filled 2018-04-20 (×5): qty 1

## 2018-04-20 MED ORDER — CEFAZOLIN SODIUM-DEXTROSE 1-4 GM/50ML-% IV SOLN
1.0000 g | Freq: Four times a day (QID) | INTRAVENOUS | Status: AC
  Administered 2018-04-20 – 2018-04-21 (×3): 1 g via INTRAVENOUS
  Filled 2018-04-20 (×3): qty 50

## 2018-04-20 MED ORDER — ONDANSETRON HCL 4 MG PO TABS: 4.0000 mg | ORAL_TABLET | Freq: Four times a day (QID) | ORAL | Status: DC | PRN

## 2018-04-20 MED ORDER — FENTANYL CITRATE (PF) 100 MCG/2ML IJ SOLN
INTRAMUSCULAR | Status: DC | PRN
  Administered 2018-04-20 (×3): 50 ug via INTRAVENOUS

## 2018-04-20 MED ORDER — MIDAZOLAM HCL 5 MG/5ML IJ SOLN
INTRAMUSCULAR | Status: DC | PRN
  Administered 2018-04-20: 2 mg via INTRAVENOUS

## 2018-04-20 MED ORDER — ACETAMINOPHEN 10 MG/ML IV SOLN: 1000.0000 mg | Freq: Once | INTRAVENOUS | Status: DC | PRN

## 2018-04-20 MED ORDER — ACETAMINOPHEN 325 MG PO TABS
325.0000 mg | ORAL_TABLET | Freq: Four times a day (QID) | ORAL | Status: DC | PRN
  Administered 2018-04-21: 325 mg via ORAL
  Filled 2018-04-20: qty 1

## 2018-04-20 MED ORDER — OXYCODONE HCL 5 MG PO TABS
10.0000 mg | ORAL_TABLET | ORAL | Status: DC | PRN
  Administered 2018-04-20 (×2): 10 mg via ORAL

## 2018-04-20 MED ORDER — METHOCARBAMOL 500 MG PO TABS
500.0000 mg | ORAL_TABLET | Freq: Four times a day (QID) | ORAL | Status: DC | PRN
  Administered 2018-04-20 – 2018-04-21 (×4): 500 mg via ORAL
  Filled 2018-04-20 (×4): qty 1

## 2018-04-20 MED ORDER — 0.9 % SODIUM CHLORIDE (POUR BTL) OPTIME
TOPICAL | Status: DC | PRN
  Administered 2018-04-20 (×2): 1000 mL

## 2018-04-20 MED ORDER — CHLORHEXIDINE GLUCONATE 4 % EX LIQD: 60.0000 mL | Freq: Once | CUTANEOUS | Status: DC

## 2018-04-20 MED ORDER — METOCLOPRAMIDE HCL 5 MG/ML IJ SOLN: 5.0000 mg | Freq: Three times a day (TID) | INTRAMUSCULAR | Status: DC | PRN

## 2018-04-20 MED ORDER — HYDROCODONE-ACETAMINOPHEN 5-325 MG PO TABS
1.0000 | ORAL_TABLET | Freq: Four times a day (QID) | ORAL | Status: DC | PRN
  Administered 2018-04-21: 1 via ORAL
  Administered 2018-04-21 – 2018-04-22 (×4): 2 via ORAL
  Filled 2018-04-20: qty 1
  Filled 2018-04-20 (×4): qty 2

## 2018-04-20 MED ORDER — METHOCARBAMOL 1000 MG/10ML IJ SOLN
500.0000 mg | Freq: Four times a day (QID) | INTRAVENOUS | Status: DC | PRN
  Filled 2018-04-20: qty 5

## 2018-04-20 MED ORDER — PROPOFOL 10 MG/ML IV BOLUS
INTRAVENOUS | Status: DC | PRN
  Administered 2018-04-20: 150 mg via INTRAVENOUS

## 2018-04-20 MED ORDER — FENTANYL CITRATE (PF) 250 MCG/5ML IJ SOLN
INTRAMUSCULAR | Status: AC
  Filled 2018-04-20: qty 5

## 2018-04-20 MED ORDER — OXYCODONE HCL 5 MG PO TABS: 5.0000 mg | ORAL_TABLET | Freq: Once | ORAL | Status: DC | PRN

## 2018-04-20 MED ORDER — DIPHENHYDRAMINE HCL 25 MG PO CAPS
25.0000 mg | ORAL_CAPSULE | Freq: Four times a day (QID) | ORAL | Status: DC | PRN
  Administered 2018-04-20: 25 mg via ORAL
  Filled 2018-04-20: qty 1

## 2018-04-20 SURGICAL SUPPLY — 56 items
BANDAGE ESMARK 6X9 LF (GAUZE/BANDAGES/DRESSINGS) IMPLANT
BIT DRILL 2.5X110 QC LCP DISP (BIT) ×3 IMPLANT
BIT DRILL LCP QC 2X140 (BIT) ×2 IMPLANT
BNDG CMPR 9X6 STRL LF SNTH (GAUZE/BANDAGES/DRESSINGS)
BNDG COHESIVE 4X5 TAN STRL (GAUZE/BANDAGES/DRESSINGS) ×3 IMPLANT
BNDG ESMARK 6X9 LF (GAUZE/BANDAGES/DRESSINGS)
BNDG GAUZE ELAST 4 BULKY (GAUZE/BANDAGES/DRESSINGS) ×1 IMPLANT
BONE CANC CHIPS 20CC PCAN1/4 (Bone Implant) ×3 IMPLANT
CHIPS CANC BONE 20CC PCAN1/4 (Bone Implant) ×1 IMPLANT
COVER SURGICAL LIGHT HANDLE (MISCELLANEOUS) ×3 IMPLANT
COVER WAND RF STERILE (DRAPES) IMPLANT
DRAPE INCISE IOBAN 66X45 STRL (DRAPES) ×3 IMPLANT
DRAPE OEC MINIVIEW 54X84 (DRAPES) ×2 IMPLANT
DRAPE ORTHO SPLIT 77X108 STRL (DRAPES) ×3
DRAPE SURG ORHT 6 SPLT 77X108 (DRAPES) IMPLANT
DRAPE U-SHAPE 47X51 STRL (DRAPES) ×3 IMPLANT
DRSG ADAPTIC 3X8 NADH LF (GAUZE/BANDAGES/DRESSINGS) ×1 IMPLANT
DRSG PAD ABDOMINAL 8X10 ST (GAUZE/BANDAGES/DRESSINGS) ×3 IMPLANT
DURAPREP 26ML APPLICATOR (WOUND CARE) ×3 IMPLANT
ELECT REM PT RETURN 9FT ADLT (ELECTROSURGICAL) ×3
ELECTRODE REM PT RTRN 9FT ADLT (ELECTROSURGICAL) ×1 IMPLANT
GAUZE SPONGE 4X4 12PLY STRL (GAUZE/BANDAGES/DRESSINGS) ×3 IMPLANT
GLOVE BIOGEL PI IND STRL 9 (GLOVE) ×1 IMPLANT
GLOVE BIOGEL PI INDICATOR 9 (GLOVE) ×2
GLOVE SURG ORTHO 9.0 STRL STRW (GLOVE) ×3 IMPLANT
GOWN STRL REUS W/ TWL XL LVL3 (GOWN DISPOSABLE) ×3 IMPLANT
GOWN STRL REUS W/TWL XL LVL3 (GOWN DISPOSABLE) ×9
K-WIRE 1.6X150 (WIRE) ×3
KIT BASIN OR (CUSTOM PROCEDURE TRAY) ×3 IMPLANT
KIT DRSG PREVENA PLUS 7DAY 125 (MISCELLANEOUS) ×2 IMPLANT
KIT PREVENA INCISION MGT20CM45 (CANNISTER) ×3 IMPLANT
KIT TURNOVER KIT B (KITS) ×3 IMPLANT
KWIRE 1.6X150 (WIRE) ×1 IMPLANT
MANIFOLD NEPTUNE II (INSTRUMENTS) ×1 IMPLANT
MIX DBX 10CC 35% BONE (Bone Implant) ×2 IMPLANT
NS IRRIG 1000ML POUR BTL (IV SOLUTION) ×5 IMPLANT
PACK ORTHO EXTREMITY (CUSTOM PROCEDURE TRAY) ×3 IMPLANT
PAD ARMBOARD 7.5X6 YLW CONV (MISCELLANEOUS) IMPLANT
PLATE DISTAL TIB 6H 112 COMP (Plate) ×2 IMPLANT
SCREW CORTEX LOW PRO 3.5X26 (Screw) ×2 IMPLANT
SCREW CORTEX LOW PRO 3.5X30 (Screw) ×6 IMPLANT
SCREW LOCKING VA 2.7X34MM (Screw) ×3 IMPLANT
SCREW LOCKING VA 2.7X40MM (Screw) ×2 IMPLANT
SCREW LOCKING VA 2.7X50MM (Screw) ×2 IMPLANT
SCREW VA LOCKING 2.7X36 (Screw) ×3 IMPLANT
SCREW VA LOCKING 2.7X36 VA (Screw) ×1 IMPLANT
STAPLER VISISTAT 35W (STAPLE) IMPLANT
SUCTION FRAZIER HANDLE 10FR (MISCELLANEOUS) ×2
SUCTION TUBE FRAZIER 10FR DISP (MISCELLANEOUS) ×1 IMPLANT
SUT ETHILON 2 0 PSLX (SUTURE) ×4 IMPLANT
SUT VIC AB 2-0 CT1 27 (SUTURE) ×3
SUT VIC AB 2-0 CT1 TAPERPNT 27 (SUTURE) ×1 IMPLANT
TOWEL OR 17X24 6PK STRL BLUE (TOWEL DISPOSABLE) ×3 IMPLANT
TOWEL OR 17X26 10 PK STRL BLUE (TOWEL DISPOSABLE) ×3 IMPLANT
TUBE CONNECTING 12'X1/4 (SUCTIONS) ×1
TUBE CONNECTING 12X1/4 (SUCTIONS) ×2 IMPLANT

## 2018-04-20 NOTE — Op Note (Signed)
04/20/2018  9:13 AM  PATIENT:  Lucas Garrett    PRE-OPERATIVE DIAGNOSIS:  Pilon Fracture Right Ankle  POST-OPERATIVE DIAGNOSIS:  Same  PROCEDURE:  OPEN REDUCTION INTERNAL FIXATION RIGHT PILON FRACTURE AND REMOVE AND REPLACE AND ADJUST EXTERNAL FIXATOR, placement of a 20 cm Praveena wound VAC.  SURGEON:  Nadara Mustard, MD  PHYSICIAN ASSISTANT:None ANESTHESIA:   General  PREOPERATIVE INDICATIONS:  DMOREA SPARGER is a  43 y.o. male with a diagnosis of Pilon Fracture Right Ankle who failed conservative measures and elected for surgical management.    The risks benefits and alternatives were discussed with the patient preoperatively including but not limited to the risks of infection, bleeding, nerve injury, cardiopulmonary complications, the need for revision surgery, among others, and the patient was willing to proceed.  OPERATIVE IMPLANTS: Synthes anterior lateral plate, external fixator Stryker, Praveena wound VAC, application of bone graft and demineralized bone graft and putty.  @ENCIMAGES @  OPERATIVE FINDINGS: Extreme comminution with multiple fragments without soft tissue attachment.  OPERATIVE PROCEDURE: Patient was brought the operating room underwent a general anesthetic.  After adequate levels anesthesia were obtained patient's right lower extremity was prepped using DuraPrep draped into a sterile field a timeout was called.  Before prepping the external fixator was removed the pin tracks were prepped using Betadine paint the wound was prepped using DuraPrep.  After the timeout was called anterior lateral incision is made the superficial peroneal nerve was protected retracted and the dissection was carried down to the tibia.  The tibia was in significant comminution.  Anterior lateral plate was placed the bony shelf was elevated the bone void was packed with demineralized bone matrix putty and crouton graft.  The shelf was replaced the fracture was reduced the anterior lateral  plate was applied with 3 compression screws proximally and 4 locking screws were placed distally.  C-arm fluoroscopy verified alignment of the mortise.  The incision was closed using 2-0 nylon.  The Praveena wound VAC was applied this had a good suction fit this was covered with Covan the external fixator was reapplied C-arm fluoroscopy was utilized throughout the case the verified reduction with the internal fixation as well as to verify congruent mortise with the external fixator.  Patient was extubated taken the PACU in stable condition.   DISCHARGE PLANNING:  Antibiotic duration: IV antibiotics 24 hours  Weightbearing: Strict nonweightbearing on the right  Pain medication: High-dose opioid pathway  Dressing care/ Wound VAC: Continue wound VAC for 1 week  Ambulatory devices: Crutches or walker  Discharge to: Anticipate discharge to home on Saturday or Sunday.  Follow-up: In the office 1 week post operative.

## 2018-04-20 NOTE — Progress Notes (Signed)
1100 Received pt from PACU, A&O x4. Right lower leg to foot with external fixator dry and intact, coban dressing dry and intact. Prevena wound vac intact, minimal serosang drainage noted. Tingling noted to right foot due to the nerve block.

## 2018-04-20 NOTE — Anesthesia Postprocedure Evaluation (Signed)
Anesthesia Post Note  Patient: Lucas Garrett  Procedure(s) Performed: OPEN REDUCTION INTERNAL FIXATION RIGHT PILON FRACTURE AND ADJUST EXTERNAL FIXATOR (Right )     Patient location during evaluation: PACU Anesthesia Type: General Level of consciousness: awake and alert Pain management: pain level controlled Vital Signs Assessment: post-procedure vital signs reviewed and stable Respiratory status: spontaneous breathing, nonlabored ventilation and respiratory function stable Cardiovascular status: blood pressure returned to baseline and stable Postop Assessment: no apparent nausea or vomiting Anesthetic complications: no    Last Vitals:  Vitals:   04/20/18 1030 04/20/18 1056  BP: 119/70 119/72  Pulse: 60 69  Resp: 13 16  Temp: (!) 36.2 C   SpO2: 100% 100%    Last Pain:  Vitals:   04/20/18 1030  TempSrc:   PainSc: 2                  Kaylyn Layer

## 2018-04-20 NOTE — Anesthesia Procedure Notes (Signed)
Anesthesia Regional Block: Popliteal block   Pre-Anesthetic Checklist: ,, timeout performed, Correct Patient, Correct Site, Correct Laterality, Correct Procedure, Correct Position, site marked, Risks and benefits discussed, pre-op evaluation,  At surgeon's request and post-op pain management  Laterality: Right  Prep: Maximum Sterile Barrier Precautions used, chloraprep       Needles:  Injection technique: Single-shot  Needle Type: Echogenic Stimulator Needle     Needle Length: 9cm  Needle Gauge: 22     Additional Needles:   Procedures:,,,, ultrasound used (permanent image in chart),,,,  Narrative:  Start time: 04/20/2018 7:20 AM End time: 04/20/2018 7:23 AM Injection made incrementally with aspirations every 5 mL.  Performed by: Personally  Anesthesiologist: Kaylyn Layer, MD  Additional Notes: Risks, benefits, and alternative discussed. Patient gave consent for procedure. Patient prepped and draped in sterile fashion. Sedation administered, patient remains easily responsive to voice. Relevant anatomy identified with ultrasound guidance. Local anesthetic given in 5cc increments with no signs or symptoms of intravascular injection. No pain or paraesthesias with injection. Patient monitored throughout procedure with signs of LAST or immediate complications. Tolerated well. Ultrasound image placed in chart.  Amalia Greenhouse, MD

## 2018-04-20 NOTE — Progress Notes (Signed)
Pt is experiencing some itching and dry mouth with oxycodone. MD notified and pain medicine switched to Vicodin and benadryl given. Will continue to monitor pt.

## 2018-04-20 NOTE — Anesthesia Procedure Notes (Signed)
Anesthesia Regional Block: Adductor canal block   Pre-Anesthetic Checklist: ,, timeout performed, Correct Patient, Correct Site, Correct Laterality, Correct Procedure, Correct Position, site marked, Risks and benefits discussed, pre-op evaluation,  At surgeon's request and post-op pain management  Laterality: Right  Prep: Maximum Sterile Barrier Precautions used, chloraprep       Needles:  Injection technique: Single-shot  Needle Type: Echogenic Stimulator Needle     Needle Length: 9cm  Needle Gauge: 22     Additional Needles:   Procedures:,,,, ultrasound used (permanent image in chart),,,,  Narrative:  Start time: 04/20/2018 7:23 AM End time: 04/20/2018 7:24 AM Injection made incrementally with aspirations every 5 mL.  Performed by: Personally  Anesthesiologist: Kaylyn LayerHowze, Cordai Rodrigue E, MD  Additional Notes: Risks, benefits, and alternative discussed. Patient gave consent for procedure. Patient prepped and draped in sterile fashion. Sedation administered, patient remains easily responsive to voice. Relevant anatomy identified with ultrasound guidance. Local anesthetic given in 5cc increments with no signs or symptoms of intravascular injection. No pain or paraesthesias with injection. Patient monitored throughout procedure with signs of LAST or immediate complications. Tolerated well. Ultrasound image placed in chart.  Amalia GreenhouseKE Cherylee Rawlinson, MD

## 2018-04-20 NOTE — Anesthesia Procedure Notes (Signed)
Procedure Name: LMA Insertion Date/Time: 04/20/2018 7:46 AM Performed by: Marny Lowenstein, CRNA Pre-anesthesia Checklist: Patient identified, Emergency Drugs available, Suction available and Patient being monitored Patient Re-evaluated:Patient Re-evaluated prior to induction Oxygen Delivery Method: Circle system utilized Preoxygenation: Pre-oxygenation with 100% oxygen Induction Type: IV induction Ventilation: Mask ventilation without difficulty LMA: LMA inserted LMA Size: 4.0 Number of attempts: 1 Placement Confirmation: positive ETCO2 and breath sounds checked- equal and bilateral Tube secured with: Tape Dental Injury: Teeth and Oropharynx as per pre-operative assessment

## 2018-04-20 NOTE — H&P (Signed)
Lucas Garrett is an 43 y.o. male.   Chief Complaint: Closed right pilon fracture. HPI: Patient is a 43 year old gentleman status post closed displaced pilon fracture on the right.  Patient was initially seen by Dr. Magnus IvanBlackman was placed in external fixator a CT scan was obtained and patient is seen today for initial evaluation and treatment.  Patient states he has difficulty sleeping complains of swelling.  He has been using Neurontin Zanaflex and Percocet for pain.  Past Medical History:  Diagnosis Date  . Pneumonia    as a child    Past Surgical History:  Procedure Laterality Date  . BRONCHOSCOPIC REMOVAL OF FOREIGN BODY    . EXTERNAL FIXATION LEG Right 04/06/2018   Procedure: EXTERNAL FIXATION RIGHT PILON FRACTURE;  Surgeon: Kathryne HitchBlackman, Christopher Y, MD;  Location: [facility];  Service: Orthopedics;  Laterality: Right;    History reviewed. No pertinent family history. Social History:  reports that he quit smoking about 4 weeks ago. His smoking use included cigarettes. He started smoking about 27 years ago. He has a 27.00 pack-year smoking history. He has never used smokeless tobacco. He reports current alcohol use of about 10.0 standard drinks of alcohol per week. He reports current drug use. Drug: Marijuana.  Allergies: No Known Allergies  Medications Prior to Admission  Medication Sig Dispense Refill  . acetaminophen (TYLENOL) 500 MG tablet Take 1,000 mg by mouth every 6 (six) hours as needed.    . gabapentin (NEURONTIN) 300 MG capsule Take 1 capsule (300 mg total) by mouth 3 (three) times daily. 60 capsule 0  . ibuprofen (ADVIL,MOTRIN) 200 MG tablet Take 800 mg by mouth every 6 (six) hours as needed.    . mupirocin ointment (BACTROBAN) 2 % Apply 1 application topically daily. 22 g 0  . oxyCODONE (OXY IR/ROXICODONE) 5 MG immediate release tablet Take 1-2 tablets (5-10 mg total) by mouth every 3 (three) hours as needed for moderate pain (pain score 4-6). 40 tablet 0  . tiZANidine  (ZANAFLEX) 4 MG tablet Take 1 tablet (4 mg total) by mouth every 8 (eight) hours as needed for muscle spasms. 40 tablet 0  . aspirin 325 MG tablet Take 1 tablet (325 mg total) by mouth 2 (two) times daily at 10 AM and 5 PM. 30 tablet 0    No results found for this or any previous visit (from the past 48 hour(s)). No results found.  Review of Systems  All other systems reviewed and are negative.   Blood pressure 132/71, pulse 66, temperature 98.1 F (36.7 C), temperature source Oral, resp. rate 18, height 6\' 4"  (1.93 m), weight 85.7 kg, SpO2 100 %. Physical Exam  Patient is alert, oriented, no adenopathy, well-dressed, normal affect, normal respiratory effort. Examination patient does have clean pin tracks.  He has a healing fracture blister over the dorsum of his ankle fracture blisters approximately 2 x 3 cm there is superficial epithelialization there is no cellulitis.  Patient has good hair growth into his foot he has a palpable dorsalis pedis and posterior tibial pulse.  Patient states that he is decreasing the amount he is smoking discussed the importance of complete  stopping smoking the importance of not using any nicotine products and the importance of not vaping.  Review of the CT scan shows significant comminution of the tibial talar joint with a large fragment medially.  There is a Weber C fibular fracture however the syndesmosis is intact. Assessment/Plan 1. Pilon fracture of right tibia, sequela  Plan: Discussed the importance of open reduction internal fixation to better align the joint.  Discussed that with the multiple fragments we would realign the larger fragments remove the loose fragments that are nonviable continue the external fixator to take stress off the repair.  Discussed the risks of infection neurovascular injury nonhealing of the skin nonhealing of the bone need for fusion surgery.  Also discussed that if patient does not completely stop smoking he is an  increased risk of the wound dehiscence and an increased risk for transtibial amputation.  Patient states he understands wished to proceed at this time.  Discussed the importance of pin tract care with half-strength hydrogen peroxide and pin tract care with Bactroban twice a day patient will also use Bactroban over the superficial epithelialization of the dorsal ankle fracture blister.  Again reinforced the importance of elevation to decrease swelling.    Nadara MustardMarcus V Duda, MD 04/20/2018, 6:36 AM

## 2018-04-20 NOTE — Transfer of Care (Signed)
Immediate Anesthesia Transfer of Care Note  Patient: Lucas Garrett  Procedure(s) Performed: OPEN REDUCTION INTERNAL FIXATION RIGHT PILON FRACTURE AND ADJUST EXTERNAL FIXATOR (Right )  Patient Location: PACU  Anesthesia Type:General  Level of Consciousness: drowsy  Airway & Oxygen Therapy: Patient Spontanous Breathing and Patient connected to face mask oxygen  Post-op Assessment: Report given to RN and Post -op Vital signs reviewed and stable  Post vital signs: Reviewed and stable  Last Vitals:  Vitals Value Taken Time  BP 109/52 04/20/2018  9:08 AM  Temp    Pulse 61 04/20/2018  9:10 AM  Resp 10 04/20/2018  9:10 AM  SpO2 99 % 04/20/2018  9:10 AM  Vitals shown include unvalidated device data.  Last Pain:  Vitals:   04/20/18 0609  TempSrc:   PainSc: 1       Patients Stated Pain Goal: 3 (04/20/18 2542)  Complications: No apparent anesthesia complications

## 2018-04-21 NOTE — Care Management Note (Signed)
Case Management Note  Patient Details  Name: Lucas Garrett MRN: [id number] Date of Birth: 1975/06/14  Subjective/Objective:   Pt to d/c home tomorrow with external fixator and Praveena wound vac to RLE.  Pt has family available for assistance and has necessary DME including crutches.  Pt does not want HH RN or other services.  Pt's injury covered by workers comp.  He will provide information when available.                   Action/Plan: CM will continue to follow   Expected Discharge Date:                  Expected Discharge Plan:  Home/Self Care  In-House Referral:  NA  Discharge planning Services  CM Consult  Post Acute Care Choice:    Choice offered to:     DME Arranged:    DME Agency:     HH Arranged:    HH Agency:     Status of Service:  In process, will continue to follow  If discussed at Long Length of Stay Meetings, dates discussed:    Additional Comments:  Deveron Furlong, RN 04/21/2018, 1:03 PM

## 2018-04-21 NOTE — Plan of Care (Signed)
  Problem: Education: Goal: Knowledge of General Education information will improve Description Including pain rating scale, medication(s)/side effects and non-pharmacologic comfort measures Outcome: Progressing   

## 2018-04-21 NOTE — Evaluation (Signed)
Physical Therapy Evaluation Patient Details Name: Lucas Garrett MRN: [id number] DOB: 05/05/1975 Today's Date: 04/21/2018   History of Present Illness  Pt is a 43 y.o. male s/p R ankle ORIF and external fixator replacement/adjustment. He fell 04/06/18 sustaining R ankle fx and underwent external fixation at that time.     Clinical Impression  Pt admitted with above diagnosis. Pt currently with functional limitations due to the deficits listed below (see PT Problem List). On eval, pt required supervision transfers and ambulation 25 feet with RW. He presents with steady gait and good ability to maintain NWB RLE. He has all needed DME from previous admission. Pt will benefit from skilled PT to increase their independence and safety with mobility to allow discharge to the venue listed below.  PT to follow acutely. No follow up services indicated.     Follow Up Recommendations No PT follow up;Supervision - Intermittent    Equipment Recommendations  None recommended by PT    Recommendations for Other Services       Precautions / Restrictions Precautions Precautions: Fall;Other (comment) Precaution Comments: wound vac RLE Restrictions RLE Weight Bearing: Non weight bearing      Mobility  Bed Mobility Overal bed mobility: Modified Independent             General bed mobility comments: +rail, no assist  Transfers Overall transfer level: Needs assistance Equipment used: Rolling walker (2 wheeled) Transfers: Sit to/from [employer] Sit to Stand: Supervision Stand pivot transfers: Supervision       General transfer comment: supervision for safety, no physical assist  Ambulation/Gait Ambulation/Gait assistance: Supervision Gait Distance (Feet): 25 Feet Assistive device: Rolling walker (2 wheeled) Gait Pattern/deviations: Step-to pattern Gait velocity: decreased   General Gait Details: steady gait. Pt able to maintain NWB RLE.  Stairs          General stair comments: Pt prefers to bump up stairs on his bottom.  Wheelchair Mobility    Modified Rankin (Stroke Patients Only)       Balance Overall balance assessment: Needs assistance Sitting-balance support: No upper extremity supported;Feet supported Sitting balance-Leahy Scale: Good     Standing balance support: During functional activity;Bilateral upper extremity supported Standing balance-Leahy Scale: Poor Standing balance comment: reliant on RW due to NWB RLE                             Pertinent Vitals/Pain Pain Assessment: 0-10 Pain Score: 4  Pain Location: R LE Pain Descriptors / Indicators: Throbbing;Sore Pain Intervention(s): Monitored during session;Limited activity within patient's tolerance;Repositioned    Home Living Family/patient expects to be discharged to:: Private residence Living Arrangements: Spouse/significant other;Children Available Help at Discharge: Family;Available 24 hours/day Type of Home: House Home Access: Stairs to enter Entrance Stairs-Rails: Right Entrance Stairs-Number of Steps: 5 Home Layout: One level Home Equipment: Crutches;Walker - 2 wheels      Prior Function Level of Independence: Independent         Comments: independent prior to 04/06/18 fall, ambulating with RW since that time     Hand Dominance        Extremity/Trunk Assessment   Upper Extremity Assessment Upper Extremity Assessment: Overall WFL for tasks assessed    Lower Extremity Assessment Lower Extremity Assessment: RLE deficits/detail RLE Deficits / Details: external fixator R distal LE    Cervical / Trunk Assessment Cervical / Trunk Assessment: Normal  Communication   Communication: No difficulties  Cognition Arousal/Alertness: Awake/alert  Behavior During Therapy: WFL for tasks assessed/performed Overall Cognitive Status: Within Functional Limits for tasks assessed                                         General Comments      Exercises     Assessment/Plan    PT Assessment Patient needs continued PT services  PT Problem List Decreased balance;Decreased knowledge of precautions;Pain;Decreased range of motion;Decreased mobility;Decreased activity tolerance       PT Treatment Interventions DME instruction;Functional mobility training;Balance training;Patient/family education;Gait training;Therapeutic activities;Stair training;Therapeutic exercise    PT Goals (Current goals can be found in the Care Plan section)  Acute Rehab PT Goals Patient Stated Goal: home tomorrow PT Goal Formulation: With patient Time For Goal Achievement: 04/28/18 Potential to Achieve Goals: Good    Frequency Min 5X/week   Barriers to discharge        Co-evaluation               AM-PAC PT "6 Clicks" Mobility  Outcome Measure Help needed turning from your back to your side while in a flat bed without using bedrails?: None Help needed moving from lying on your back to sitting on the side of a flat bed without using bedrails?: None Help needed moving to and from a bed to a chair (including a wheelchair)?: None Help needed standing up from a chair using your arms (e.g., wheelchair or bedside chair)?: None Help needed to walk in hospital room?: A Little Help needed climbing 3-5 steps with a railing? : A Little 6 Click Score: 22    End of Session   Activity Tolerance: Patient tolerated treatment well Patient left: with call bell/phone within reach;Other (comment)(in bathroom) Nurse Communication: Mobility status;Other (comment);Precautions(Pt in bathroom.) PT Visit Diagnosis: Difficulty in walking, not elsewhere classified (R26.2);Pain Pain - Right/Left: Right Pain - part of body: Leg    Time: 6979-4801 PT Time Calculation (min) (ACUTE ONLY): 11 min   Charges:   PT Evaluation $PT Eval Low Complexity: 1 Low          Aida Raider, PT  Office # [phone] Pager [phone]   Ilda Foil 04/21/2018, 9:30 AM

## 2018-04-21 NOTE — Progress Notes (Signed)
Patient ID: Lucas Garrett, male   DOB: 1975/06/14, 43 y.o.   MRN: [id number] Postoperative day 1 open reduction internal fixation comminuted pilon fracture right ankle.  Dressing is intact there is no drainage in the wound VAC canister.  Plan for therapy today discharge to home on Sunday.

## 2018-04-22 MED ORDER — OXYCODONE-ACETAMINOPHEN 5-325 MG PO TABS: 1.0000 | ORAL_TABLET | ORAL | 0 refills | Status: DC | PRN

## 2018-04-22 NOTE — Discharge Summary (Signed)
Discharge Diagnoses:  Active Problems:   Pilon fracture of right tibia, sequela   Surgeries: Procedure(s): OPEN REDUCTION INTERNAL FIXATION RIGHT PILON FRACTURE AND ADJUST EXTERNAL FIXATOR on 04/20/2018    Consultants:   Discharged Condition: Improved  Hospital Course: Lucas Garrett is an 43 y.o. male who was admitted 04/20/2018 with a chief complaint of right pilon fracture, with a final diagnosis of Pilon Fracture Right Ankle.  Patient was brought to the operating room on 04/20/2018 and underwent Procedure(s): OPEN REDUCTION INTERNAL FIXATION RIGHT PILON FRACTURE AND ADJUST EXTERNAL FIXATOR.    Patient was given perioperative antibiotics:  Anti-infectives (From admission, onward)   Start     Dose/Rate Route Frequency Ordered Stop   04/20/18 1230  ceFAZolin (ANCEF) IVPB 1 g/50 mL premix     1 g 100 mL/hr over 30 Minutes Intravenous Every 6 hours 04/20/18 1059 04/21/18 0058   04/20/18 0600  ceFAZolin (ANCEF) IVPB 2g/100 mL premix     2 g 200 mL/hr over 30 Minutes Intravenous On call to O.R. 04/20/18 1610 04/20/18 0802    .  Patient was given sequential compression devices, early ambulation, and aspirin for DVT prophylaxis.  Recent vital signs:  Patient Vitals for the past 24 hrs:  BP Temp Temp src Pulse Resp SpO2  04/22/18 0508 128/79 98.9 F (37.2 C) Oral (!) 53 - 99 %  04/21/18 1917 117/77 98.8 F (37.1 C) Oral (!) 57 - 98 %  04/21/18 1439 123/78 98.4 F (36.9 C) Oral (!) 59 18 99 %  .  Recent laboratory studies: No results found.  Discharge Medications:   Allergies as of 04/22/2018      Reactions   Oxycodone Itching, Other (See Comments)   Dry mouth      Medication List    STOP taking these medications   oxyCODONE 5 MG immediate release tablet Commonly known as:  Oxy IR/ROXICODONE     TAKE these medications   acetaminophen 500 MG tablet Commonly known as:  TYLENOL Take 1,000 mg by mouth every 6 (six) hours as needed for mild pain.   aspirin 325 MG  tablet Take 1 tablet (325 mg total) by mouth 2 (two) times daily at 10 AM and 5 PM.   gabapentin 300 MG capsule Commonly known as:  NEURONTIN Take 1 capsule (300 mg total) by mouth 3 (three) times daily.   ibuprofen 200 MG tablet Commonly known as:  ADVIL,MOTRIN Take 800 mg by mouth every 6 (six) hours as needed for mild pain.   mupirocin ointment 2 % Commonly known as:  BACTROBAN Apply 1 application topically daily.   oxyCODONE-acetaminophen 5-325 MG tablet Commonly known as:  PERCOCET/ROXICET Take 1 tablet by mouth every 4 (four) hours as needed.   tiZANidine 4 MG tablet Commonly known as:  ZANAFLEX Take 1 tablet (4 mg total) by mouth every 8 (eight) hours as needed for muscle spasms. What changed:  when to take this            Discharge Care Instructions  (From admission, onward)         Start     Ordered   04/22/18 0000  Non weight bearing    Question Answer Comment  Laterality right   Extremity Lower      04/22/18 1419          Diagnostic Studies: Dg Tibia/fibula Right  Result Date: 04/06/2018 CLINICAL DATA:  Fall, trauma EXAM: RIGHT TIBIA AND FIBULA - 2 VIEW COMPARISON:  None. FINDINGS: artifact about  the knee from the overlying sheet is present on AP projection. On lateral projection no evidence of fracture of the proximal tibia. Complex comminuted intra-articular fracture of the distal tibia and fibula is partially imaged. See ankle films. IMPRESSION: 1. No evidence of proximal tibial fracture on suboptimal exam. 2. Complex distal tibiofibular fracture described on ankle film. Electronically Signed   By: Genevive Bi M.D.   On: 04/06/2018 17:11   Dg Ankle Complete Right  Result Date: 04/06/2018 CLINICAL DATA:  Right ankle fracture EXAM: RIGHT ANKLE - COMPLETE 3+ VIEW COMPARISON:  04/06/2018 FINDINGS: Four low resolution intraoperative spot views of the right ankle. Total fluoroscopy time was 0.5 minutes. Comminuted distal fibular and tibial fractures.  Placement of external fixation device. IMPRESSION: Intraoperative fluoroscopic assistance provided during placement of external fixation device for ankle fracture Electronically Signed   By: Jasmine Pang M.D.   On: 04/06/2018 21:31   Dg Ankle Complete Right  Result Date: 04/06/2018 CLINICAL DATA:  Larey Seat at work, knew he was going to fall so jumped from scaffolding, caught foot, injury to foot/ankle, pain, initial encounter EXAM: RIGHT ANKLE - COMPLETE 3+ VIEW COMPARISON:  None FINDINGS: Osseous mineralization normal for technique. Oblique fracture distal RIGHT fibular diaphysis, minimally displaced laterally with mild apex lateral angulation and minimal overriding. Comminuted distal RIGHT tibial metadiaphyseal fracture with extension into tibiotalar joint. Large displaced posterior articular fragment. Medial displacement of a dominant medial articular fragment. Mild apex medial and anterior angulation at fracture. No gross dislocation. Tarsals appear intact and normally aligned. IMPRESSION: Oblique displaced and minimally angulated distal RIGHT fibular diaphyseal fracture. Comminuted displaced intra-articular distal RIGHT tibial metadiaphyseal fracture. Electronically Signed   By: Ulyses Southward M.D.   On: 04/06/2018 17:12   Ct Ankle Right Wo Contrast  Result Date: 04/07/2018 CLINICAL DATA:  Pilon fracture, status post external fixation. EXAM: CT OF THE RIGHT ANKLE WITHOUT CONTRAST TECHNIQUE: Multidetector CT imaging of the right ankle was performed according to the standard protocol. Multiplanar CT image reconstructions were also generated. COMPARISON:  Radiographs from 04/06/2018 FINDINGS: Bones/Joint/Cartilage Type 3 pilon fracture noted (OTA 43-C3) with extensive comminution of the intersecting fracture planes along the distal tibial articular surface and extensive comminution of the extra-articular components of the distal tibia which extend out 10 cm from the tibial articular surface. Among the distal  tibial shaft fragments, a couple of fragments are somewhat imbedded within the fracture planes, including the 2.3 cm fragment shown medially on image 41/7. The distal tibial fractures also extend into the distal tibiofibular joint centrally and anteriorly, and there is widening of the medial plafond and posteriorly as well as widening of the distance between the fibula and the talus. There multiple small fragments of bone along the articular surface of the distal tibia and a couple of tiny bony fragments loose within the joint. One of the dominant distal tibial fracture planes is distracted by about 9 mm at the joint as shown medially on image 52/8. Mildly comminuted distal fibular shaft fracture observed. No appreciable fractures of the talus, calcaneus, midfoot, or along the Lisfranc joint. The metatarsals are all intact. The toes are not completely included. External fixator noted extending through the posterior calcaneus. Ligaments Suboptimally assessed by CT. Muscles and Tendons There is some partial entrapment of the tibialis posterior and flexor digitorum longus tendons along the distracted posteromedial fracture plane as shown on consecutive images 57 through 79 of series 6. Both tendons partially extend into the region of distraction. Soft tissues As expected there  is extensive subcutaneous edema. No obvious bony fragments in the vicinity of the tibial nerve. There are some fracture planes close to the saphenous nerve along the distal tibia and close to the intermediate dorsal cutaneous nerve as well. IMPRESSION: 1. Type 3 pilon fracture (OTA 43-C3) as described above. Notably, the tibialis posterior and flexor digitorum longus are both partially entrapped along the distracted posteromedial fracture plane of the distal tibia. Electronically Signed   By: Gaylyn RongWalter  Liebkemann M.D.   On: 04/07/2018 12:13   Ct 3d Recon At Scanner  Result Date: 04/07/2018 CLINICAL DATA:  Pilon fracture, status post external  fixation. EXAM: CT OF THE RIGHT ANKLE WITHOUT CONTRAST TECHNIQUE: Multidetector CT imaging of the right ankle was performed according to the standard protocol. Multiplanar CT image reconstructions were also generated. COMPARISON:  Radiographs from 04/06/2018 FINDINGS: Bones/Joint/Cartilage Type 3 pilon fracture noted (OTA 43-C3) with extensive comminution of the intersecting fracture planes along the distal tibial articular surface and extensive comminution of the extra-articular components of the distal tibia which extend out 10 cm from the tibial articular surface. Among the distal tibial shaft fragments, a couple of fragments are somewhat imbedded within the fracture planes, including the 2.3 cm fragment shown medially on image 41/7. The distal tibial fractures also extend into the distal tibiofibular joint centrally and anteriorly, and there is widening of the medial plafond and posteriorly as well as widening of the distance between the fibula and the talus. There multiple small fragments of bone along the articular surface of the distal tibia and a couple of tiny bony fragments loose within the joint. One of the dominant distal tibial fracture planes is distracted by about 9 mm at the joint as shown medially on image 52/8. Mildly comminuted distal fibular shaft fracture observed. No appreciable fractures of the talus, calcaneus, midfoot, or along the Lisfranc joint. The metatarsals are all intact. The toes are not completely included. External fixator noted extending through the posterior calcaneus. Ligaments Suboptimally assessed by CT. Muscles and Tendons There is some partial entrapment of the tibialis posterior and flexor digitorum longus tendons along the distracted posteromedial fracture plane as shown on consecutive images 57 through 79 of series 6. Both tendons partially extend into the region of distraction. Soft tissues As expected there is extensive subcutaneous edema. No obvious bony fragments in  the vicinity of the tibial nerve. There are some fracture planes close to the saphenous nerve along the distal tibia and close to the intermediate dorsal cutaneous nerve as well. IMPRESSION: 1. Type 3 pilon fracture (OTA 43-C3) as described above. Notably, the tibialis posterior and flexor digitorum longus are both partially entrapped along the distracted posteromedial fracture plane of the distal tibia. Electronically Signed   By: Gaylyn RongWalter  Liebkemann M.D.   On: 04/07/2018 12:13   Dg Foot Complete Right  Result Date: 04/06/2018 CLINICAL DATA:  Larey SeatFell at work, knew he was going to fall so jumped from scaffolding, caught foot, injury to foot/ankle, pain, initial encounter EXAM: RIGHT FOOT COMPLETE - 3+ VIEW COMPARISON:  None FINDINGS: Complex sacral fractures as reported separately. Awesome in [place] a shin normal. Joint spaces within the foot preserved. No additional fracture, dislocation, or bone destruction. IMPRESSION: Complex ankle fractures as reported separately. No additional foot abnormalities. Electronically Signed   By: Ulyses SouthwardMark  Boles M.D.   On: 04/06/2018 17:33   Dg C-arm 1-60 Min-no Report  Result Date: 04/06/2018 Fluoroscopy was utilized by the requesting physician.  No radiographic interpretation.    Patient benefited maximally  from their hospital stay and there were no complications.     Disposition: Discharge disposition: 01-Home or Self Care      Discharge Instructions    Call MD / Call 911   Complete by:  As directed    If you experience chest pain or shortness of breath, CALL 911 and be transported to the hospital emergency room.  If you develope a fever above 101 F, pus (white drainage) or increased drainage or redness at the wound, or calf pain, call your surgeon's office.   Constipation Prevention   Complete by:  As directed    Drink plenty of fluids.  Prune juice may be helpful.  You may use a stool softener, such as Colace (over the counter) 100 mg twice a day.  Use MiraLax  (over the counter) for constipation as needed.   Diet - low sodium heart healthy   Complete by:  As directed    Increase activity slowly as tolerated   Complete by:  As directed    Negative Pressure Wound Therapy - Incisional   Complete by:  As directed    Non weight bearing   Complete by:  As directed    Laterality:  right   Extremity:  Lower     Follow-up Information    Nadara Mustarduda,  V, MD In 1 week.   Specialty:  Orthopedic Surgery Contact information: [address] [place] [address] [phone]            Signed: Nadara Mustard V  04/22/2018, 2:19 PM

## 2018-04-22 NOTE — Progress Notes (Signed)
Physical Therapy Treatment Patient Details Name: Lucas Garrett MRN: [id number] DOB: February 20, 1976 Today's Date: 04/22/2018    History of Present Illness Pt is a 43 y.o. male s/p R ankle ORIF and external fixator replacement/adjustment. He fell 04/06/18 sustaining R ankle fx and underwent external fixation at that time.     PT Comments    Pt demonstrates I/Mod I mobility, utilizing RW for ambulation. All education complete. Pt verbalizes understanding of precautions and expresses no need for further PT intervention. Plan is for d/c home today. PT signing off.    Follow Up Recommendations  No PT follow up;Supervision - Intermittent     Equipment Recommendations  None recommended by PT    Recommendations for Other Services       Precautions / Restrictions Precautions Precautions: Fall;Other (comment) Precaution Comments: wound vac RLE Restrictions Weight Bearing Restrictions: Yes RLE Weight Bearing: Non weight bearing    Mobility  Bed Mobility Overal bed mobility: Independent             General bed mobility comments: no rail, no assist  Transfers Overall transfer level: Modified independent Equipment used: Rolling walker (2 wheeled) Transfers: Sit to/from [employer] Sit to Stand: Modified independent (Device/Increase time) Stand pivot transfers: Modified independent (Device/Increase time)       General transfer comment: Pt demo safe technique  Ambulation/Gait Ambulation/Gait assistance: Modified independent (Device/Increase time) Gait Distance (Feet): 25 Feet Assistive device: Rolling walker (2 wheeled) Gait Pattern/deviations: Step-to pattern Gait velocity: decreased   General Gait Details: steady gait. Pt able to maintain NWB RLE.   Stairs         General stair comments: Pt prefers to bump up stairs on his bottom.   Wheelchair Mobility    Modified Rankin (Stroke Patients Only)       Balance   Sitting-balance support: No  upper extremity supported;Feet supported Sitting balance-Leahy Scale: Good     Standing balance support: During functional activity;Bilateral upper extremity supported Standing balance-Leahy Scale: Fair Standing balance comment: reliant on RW due to NWB RLE                            Cognition Arousal/Alertness: Awake/alert Behavior During Therapy: WFL for tasks assessed/performed Overall Cognitive Status: Within Functional Limits for tasks assessed                                        Exercises      General Comments        Pertinent Vitals/Pain Pain Assessment: Faces Faces Pain Scale: Hurts a little bit Pain Location: R LE Pain Descriptors / Indicators: Sore Pain Intervention(s): Monitored during session    Home Living                      Prior Function            PT Goals (current goals can now be found in the care plan section) Acute Rehab PT Goals Patient Stated Goal: home today PT Goal Formulation: All assessment and education complete, DC therapy Progress towards PT goals: Goals met/education completed, patient discharged from PT    Frequency    Min 5X/week      PT Plan Current plan remains appropriate    Co-evaluation              AM-PAC PT "  6 Clicks" Mobility   Outcome Measure  Help needed turning from your back to your side while in a flat bed without using bedrails?: None Help needed moving from lying on your back to sitting on the side of a flat bed without using bedrails?: None Help needed moving to and from a bed to a chair (including a wheelchair)?: None Help needed standing up from a chair using your arms (e.g., wheelchair or bedside chair)?: None Help needed to walk in hospital room?: None Help needed climbing 3-5 steps with a railing? : A Little 6 Click Score: 23    End of Session   Activity Tolerance: Patient tolerated treatment well Patient left: in bed;with call bell/phone within  reach Nurse Communication: Mobility status PT Visit Diagnosis: Difficulty in walking, not elsewhere classified (R26.2);Pain Pain - Right/Left: Right Pain - part of body: Leg     Time: 5072-2575 PT Time Calculation (min) (ACUTE ONLY): 10 min  Charges:  $Gait Training: 8-22 mins                     Lorrin Goodell, PT  Office # [phone] Pager [phone]    Lorriane Shire 04/22/2018, 10:57 AM

## 2018-04-23 ENCOUNTER — Encounter (HOSPITAL_COMMUNITY): Payer: Self-pay | Admitting: Orthopedic Surgery

## 2018-04-27 ENCOUNTER — Ambulatory Visit (INDEPENDENT_AMBULATORY_CARE_PROVIDER_SITE_OTHER): Payer: Worker's Compensation | Admitting: Physician Assistant

## 2018-04-27 ENCOUNTER — Ambulatory Visit (INDEPENDENT_AMBULATORY_CARE_PROVIDER_SITE_OTHER): Payer: Worker's Compensation

## 2018-04-27 ENCOUNTER — Encounter (INDEPENDENT_AMBULATORY_CARE_PROVIDER_SITE_OTHER): Payer: Self-pay | Admitting: Physician Assistant

## 2018-04-27 VITALS — Ht 76.0 in | Wt 189.0 lb

## 2018-04-27 DIAGNOSIS — S82871S Displaced pilon fracture of right tibia, sequela: Secondary | ICD-10-CM

## 2018-04-27 DIAGNOSIS — S82874D Nondisplaced pilon fracture of right tibia, subsequent encounter for closed fracture with routine healing: Secondary | ICD-10-CM

## 2018-04-27 MED ORDER — TIZANIDINE HCL 4 MG PO TABS: 4.0000 mg | ORAL_TABLET | Freq: Three times a day (TID) | ORAL | 0 refills | Status: DC | PRN

## 2018-04-27 MED ORDER — GABAPENTIN 300 MG PO CAPS: 300.0000 mg | ORAL_CAPSULE | Freq: Three times a day (TID) | ORAL | 0 refills | Status: DC

## 2018-04-27 MED ORDER — ASPIRIN 325 MG PO TABS: 325.0000 mg | ORAL_TABLET | Freq: Two times a day (BID) | ORAL | 0 refills | Status: DC

## 2018-04-27 NOTE — Progress Notes (Signed)
Office Visit Note   Patient: Lucas Garrett           Date of Birth: 05-02-1975           MRN: [id number] Visit Date: 04/27/2018              Requested by: No referring provider defined for this encounter. PCP: Patient, No Pcp Per  Chief Complaint  Patient presents with  . Right Ankle - Routine Post Op    04/20/2018 ORIF pilon fx ex fix adjustment       HPI: The patient is a 43 year old gentleman who is seen for postoperative follow-up following open reduction internal fixation of his right ankle pilon fracture and removal and replacement and adjustment of his external fixation with placement of a Praveena wound VAC over the incision.  Surgery was 04/20/2018 he is 1 week postop.  He reports moderate pain over the area.  The wound VAC was removed today and he does have moderate edema over the ankle.  He is nonweightbearing and using a walker for ambulation.He is trying to completely quit smoking.   Assessment & Plan: Visit Diagnoses:  1. Closed nondisplaced pilon fracture of right tibia with routine healing, subsequent encounter   2. Pilon fracture of right tibia, sequela     Plan: Praveena VAC was removed this visit. Dry dressing to the incisional area daily and kerlix and ace wrap. Counseled to elevate the right foot as much as possible and continue strict non weight bearing with walker. Prescriptions for gabapentin, zanaflex and ASA refilled this visit. Counseled to use whey protein supplement, MVI, Vitamin C and Probiotics .   We will leave sutures in place.  He will follow-up in about 2 weeks or sooner should he have difficulties in the interim.  Follow-Up Instructions: Return in about 17 days (around 05/14/2018).   Ortho Exam  Patient is alert, oriented, no adenopathy, well-dressed, normal affect, normal respiratory effort. The Praveena VAC was removed from the incisional area and the incision is slightly widened with sutures intact.  There is moderate edema localized to the  ankle and foot.  He has good pedal pulses.  He is neurovascularly intact in the toes and can wiggle the toes.  There are no signs of cellulitis or infection.  Imaging: Xr Ankle Complete Right  Result Date: 04/27/2018 3 view of right ankle with stable internal and external fixation in place.   No images are attached to the encounter.  Labs: No results found for: HGBA1C, ESRSEDRATE, CRP, LABURIC, REPTSTATUS, GRAMSTAIN, CULT, LABORGA   Lab Results  Component Value Date   ALBUMIN 3.3 (L) 04/20/2018    Body mass index is 23.01 kg/m.  Orders:  Orders Placed This Encounter  Procedures  . XR Ankle Complete Right   Meds ordered this encounter  Medications  . tiZANidine (ZANAFLEX) 4 MG tablet    Sig: Take 1 tablet (4 mg total) by mouth every 8 (eight) hours as needed for muscle spasms.    Dispense:  40 tablet    Refill:  0  . aspirin 325 MG tablet    Sig: Take 1 tablet (325 mg total) by mouth 2 (two) times daily at 10 AM and 5 PM.    Dispense:  30 tablet    Refill:  0  . gabapentin (NEURONTIN) 300 MG capsule    Sig: Take 1 capsule (300 mg total) by mouth 3 (three) times daily.    Dispense:  60 capsule    Refill:  0     Procedures: No procedures performed  Clinical Data: No additional findings.  ROS:  All other systems negative, except as noted in the HPI. Review of Systems  Objective: Vital Signs: Ht 6\' 4"  (1.93 m)   Wt 189 lb (85.7 kg)   BMI 23.01 kg/m   Specialty Comments:  No specialty comments available.  PMFS History: Patient Active Problem List   Diagnosis Date Noted  . Pilon fracture of right tibia, sequela 04/06/2018   Past Medical History:  Diagnosis Date  . Pneumonia    as a child    History reviewed. No pertinent family history.  Past Surgical History:  Procedure Laterality Date  . BRONCHOSCOPIC REMOVAL OF FOREIGN BODY    . EXTERNAL FIXATION LEG Right 04/06/2018   Procedure: EXTERNAL FIXATION RIGHT PILON FRACTURE;  Surgeon: Kathryne Hitch, MD;  Location: [facility];  Service: Orthopedics;  Laterality: Right;  . ORIF ANKLE FRACTURE Right 04/20/2018   Procedure: OPEN REDUCTION INTERNAL FIXATION RIGHT PILON FRACTURE AND ADJUST EXTERNAL FIXATOR;  Surgeon: Nadara Mustard, MD;  Location: [facility];  Service: Orthopedics;  Laterality: Right;   Social History   Occupational History  . 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 Smoking status: Former Smoker    Packs/day: 1.00    Years: 27.00    Pack years: 27.00    Types: Cigarettes    Start date: 04/07/1991    Last attempt to quit: 03/17/2018    Years since quitting: 0.1  . Smokeless tobacco: Never Used  . Tobacco comment: "I want to" Quit  Substance and Sexual Activity  . Alcohol use: Yes    Alcohol/week: 10.0 standard drinks    Types: 10 Cans of beer per week    Comment: 5, 2Xweek-  04/19/2018- none since last surgery  . Drug use: Yes    Types: Marijuana    Comment: last time -04/17/2018  . Sexual activity: Yes    Partners: Female

## 2018-04-30 ENCOUNTER — Telehealth (INDEPENDENT_AMBULATORY_CARE_PROVIDER_SITE_OTHER): Payer: Self-pay

## 2018-04-30 NOTE — Telephone Encounter (Signed)
Faxed the 04/27/18 office note to the adjustor per her request

## 2018-05-01 ENCOUNTER — Telehealth (INDEPENDENT_AMBULATORY_CARE_PROVIDER_SITE_OTHER): Payer: Self-pay

## 2018-05-01 NOTE — Telephone Encounter (Signed)
Received voice mail from [facility] adj requesting work note for this patient from the last office visit and I didn't see one in chart. Can you please fax one to her @ [phone]

## 2018-05-02 ENCOUNTER — Other Ambulatory Visit (INDEPENDENT_AMBULATORY_CARE_PROVIDER_SITE_OTHER): Payer: Self-pay

## 2018-05-02 NOTE — Telephone Encounter (Signed)
There is a note in chart for out of work for 4-6 weeks. This has been faxed to number provided. Just an [place]

## 2018-05-14 ENCOUNTER — Ambulatory Visit (INDEPENDENT_AMBULATORY_CARE_PROVIDER_SITE_OTHER): Payer: Worker's Compensation

## 2018-05-14 ENCOUNTER — Ambulatory Visit (INDEPENDENT_AMBULATORY_CARE_PROVIDER_SITE_OTHER): Payer: Worker's Compensation | Admitting: Physician Assistant

## 2018-05-14 ENCOUNTER — Encounter (INDEPENDENT_AMBULATORY_CARE_PROVIDER_SITE_OTHER): Payer: Self-pay | Admitting: Orthopedic Surgery

## 2018-05-14 VITALS — Ht 76.0 in | Wt 189.0 lb

## 2018-05-14 DIAGNOSIS — S82874D Nondisplaced pilon fracture of right tibia, subsequent encounter for closed fracture with routine healing: Secondary | ICD-10-CM

## 2018-05-14 MED ORDER — OXYCODONE-ACETAMINOPHEN 5-325 MG PO TABS: 1.0000 | ORAL_TABLET | ORAL | 0 refills | Status: DC | PRN

## 2018-05-14 NOTE — Progress Notes (Signed)
Office Visit Note   Patient: Lucas Garrett           Date of Birth: May 16, 1975           MRN: [id number] Visit Date: 05/14/2018              Requested by: No referring provider defined for this encounter. PCP: Patient, No Pcp Per  Chief Complaint  Patient presents with  . Right Ankle - Routine Post Op    04/20/2018 ORIF Pilon FX ex fix       HPI: The patient is a 43 year old gentleman who is seen for postoperative follow-up following open reduction internal fixation of his right ankle P line fracture and removal and replacement and adjustment of his external fixation with placement of a Praveena VAC on 04/20/2018.  He is approximately 3 weeks postop.  He reports continued moderate pain over the area.  He had some initial blistering over the incisional areas and this is resolved.  He is continuing to maintain nonweightbearing and ambulating with a walker or crutches.  Assessment & Plan: Visit Diagnoses:  1. Closed nondisplaced pilon fracture of right tibia with routine healing, subsequent encounter     Plan: Sutures were harvested this visit.  Continue pin site care with half-strength hydrogen peroxide and Bactroban ointment to pin sites. Continue nonweightbearing may use lotion to the scars and massage the area.  Okay to wiggle toes and foot as able within current fixation.  He will follow-up here in 2 weeks with follow-up radiographs at that time.  Follow-Up Instructions: Return in about 2 weeks (around 05/28/2018).   Ortho Exam  Patient is alert, oriented, no adenopathy, well-dressed, normal affect, normal respiratory effort. Examination of the right ankle shows the blistering over the incisional areas to be completely resolved.  The incision is healing well and sutures were removed this visit.  Pin sites are clean with minimal tenting.  There is mild localized edema about the foot and ankle but this is overall improved.  He is able to wiggle his toes and his foot slightly and has  been okayed to do this. Good pedal pulses.  No signs of infection or cellulitis. Imaging: No results found. No images are attached to the encounter.  Labs: No results found for: HGBA1C, ESRSEDRATE, CRP, LABURIC, REPTSTATUS, GRAMSTAIN, CULT, LABORGA   Lab Results  Component Value Date   ALBUMIN 3.3 (L) 04/20/2018    Body mass index is 23.01 kg/m.  Orders:  Orders Placed This Encounter  Procedures  . XR Ankle Complete Right   Meds ordered this encounter  Medications  . oxyCODONE-acetaminophen (PERCOCET/ROXICET) 5-325 MG tablet    Sig: Take 1 tablet by mouth every 4 (four) hours as needed.    Dispense:  30 tablet    Refill:  0     Procedures: No procedures performed  Clinical Data: No additional findings.  ROS:  All other systems negative, except as noted in the HPI. Review of Systems  Objective: Vital Signs: Ht 6\' 4"  (1.93 m)   Wt 189 lb (85.7 kg)   BMI 23.01 kg/m   Specialty Comments:  No specialty comments available.  PMFS History: Patient Active Problem List   Diagnosis Date Noted  . Pilon fracture of right tibia, sequela 04/06/2018   Past Medical History:  Diagnosis Date  . Pneumonia    as a child    History reviewed. No pertinent family history.  Past Surgical History:  Procedure Laterality Date  . BRONCHOSCOPIC REMOVAL  OF FOREIGN BODY    . EXTERNAL FIXATION LEG Right 04/06/2018   Procedure: EXTERNAL FIXATION RIGHT PILON FRACTURE;  Surgeon: Kathryne Hitch, MD;  Location: [facility];  Service: Orthopedics;  Laterality: Right;  . ORIF ANKLE FRACTURE Right 04/20/2018   Procedure: OPEN REDUCTION INTERNAL FIXATION RIGHT PILON FRACTURE AND ADJUST EXTERNAL FIXATOR;  Surgeon: Nadara Mustard, MD;  Location: [facility];  Service: Orthopedics;  Laterality: Right;   Social History   Occupational History  . 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 Smoking status: Former Smoker    Packs/day: 1.00    Years:  27.00    Pack years: 27.00    Types: Cigarettes    Start date: 04/07/1991    Last attempt to quit: 03/17/2018    Years since quitting: 0.1  . Smokeless tobacco: Never Used  . Tobacco comment: "I want to" Quit  Substance and Sexual Activity  . Alcohol use: Yes    Alcohol/week: 10.0 standard drinks    Types: 10 Cans of beer per week    Comment: 5, 2Xweek-  04/19/2018- none since last surgery  . Drug use: Yes    Types: Marijuana    Comment: last time -04/17/2018  . Sexual activity: Yes    Partners: Female

## 2018-05-16 ENCOUNTER — Encounter (INDEPENDENT_AMBULATORY_CARE_PROVIDER_SITE_OTHER): Payer: Self-pay | Admitting: Physician Assistant

## 2018-05-17 ENCOUNTER — Telehealth (INDEPENDENT_AMBULATORY_CARE_PROVIDER_SITE_OTHER): Payer: Self-pay

## 2018-05-17 NOTE — Telephone Encounter (Signed)
Faxed the 05/14/18 office note to [facility] adj per her request

## 2018-05-29 ENCOUNTER — Ambulatory Visit (INDEPENDENT_AMBULATORY_CARE_PROVIDER_SITE_OTHER): Payer: Worker's Compensation | Admitting: Orthopedic Surgery

## 2018-05-29 ENCOUNTER — Encounter (INDEPENDENT_AMBULATORY_CARE_PROVIDER_SITE_OTHER): Payer: Self-pay | Admitting: Orthopedic Surgery

## 2018-05-29 ENCOUNTER — Ambulatory Visit (INDEPENDENT_AMBULATORY_CARE_PROVIDER_SITE_OTHER): Payer: Worker's Compensation

## 2018-05-29 VITALS — Ht 76.0 in | Wt 189.0 lb

## 2018-05-29 DIAGNOSIS — S82874D Nondisplaced pilon fracture of right tibia, subsequent encounter for closed fracture with routine healing: Secondary | ICD-10-CM

## 2018-05-29 MED ORDER — OXYCODONE-ACETAMINOPHEN 5-325 MG PO TABS: 1.0000 | ORAL_TABLET | ORAL | 0 refills | Status: DC | PRN

## 2018-06-01 ENCOUNTER — Encounter (INDEPENDENT_AMBULATORY_CARE_PROVIDER_SITE_OTHER): Payer: Self-pay | Admitting: Orthopedic Surgery

## 2018-06-01 ENCOUNTER — Telehealth (INDEPENDENT_AMBULATORY_CARE_PROVIDER_SITE_OTHER): Payer: Self-pay

## 2018-06-01 NOTE — Telephone Encounter (Signed)
Fax 05/29/18 office note when ready

## 2018-06-01 NOTE — Progress Notes (Signed)
Office Visit Note   Patient: Lucas Garrett           Date of Birth: 08/24/1975           MRN: [id number] Visit Date: 05/29/2018              Requested by: No referring provider defined for this encounter. PCP: Patient, No Pcp Per  Chief Complaint  Patient presents with  . Right Ankle - Routine Post Op    04/20/2018 ORIF Pilon fx ex fix      HPI: Patient is a 43 year old gentleman who presents 5 weeks status post external fixation for the right [place] fracture.   Assessment & Plan: Visit Diagnoses:  1. Closed nondisplaced pilon fracture of right tibia with routine healing, subsequent encounter     Plan: We will leave the external fixator in place for 1 additional week.  Follow-Up Instructions: No follow-ups on file.   Ortho Exam  Patient is alert, oriented, no adenopathy, well-dressed, normal affect, normal respiratory effort. Examination the surgical incision is well-healed the pin tracks are clean and dry no cellulitis no signs of infection.  Imaging: No results found. No images are attached to the encounter.  Labs: No results found for: HGBA1C, ESRSEDRATE, CRP, LABURIC, REPTSTATUS, GRAMSTAIN, CULT, LABORGA   Lab Results  Component Value Date   ALBUMIN 3.3 (L) 04/20/2018    Body mass index is 23.01 kg/m.  Orders:  Orders Placed This Encounter  Procedures  . XR Ankle Complete Right   Meds ordered this encounter  Medications  . oxyCODONE-acetaminophen (PERCOCET/ROXICET) 5-325 MG tablet    Sig: Take 1 tablet by mouth every 4 (four) hours as needed.    Dispense:  30 tablet    Refill:  0     Procedures: No procedures performed  Clinical Data: No additional findings.  ROS:  All other systems negative, except as noted in the HPI. Review of Systems  Objective: Vital Signs: Ht 6\' 4"  (1.93 m)   Wt 189 lb (85.7 kg)   BMI 23.01 kg/m   Specialty Comments:  No specialty comments available.  PMFS History: Patient Active Problem List   Diagnosis Date Noted  . Pilon fracture of right tibia, sequela 04/06/2018   Past Medical History:  Diagnosis Date  . Pneumonia    as a child    History reviewed. No pertinent family history.  Past Surgical History:  Procedure Laterality Date  . BRONCHOSCOPIC REMOVAL OF FOREIGN BODY    . EXTERNAL FIXATION LEG Right 04/06/2018   Procedure: EXTERNAL FIXATION RIGHT PILON FRACTURE;  Surgeon: Kathryne Hitch, MD;  Location: [facility];  Service: Orthopedics;  Laterality: Right;  . ORIF ANKLE FRACTURE Right 04/20/2018   Procedure: OPEN REDUCTION INTERNAL FIXATION RIGHT PILON FRACTURE AND ADJUST EXTERNAL FIXATOR;  Surgeon: Nadara Mustard, MD;  Location: [facility];  Service: Orthopedics;  Laterality: Right;   Social History   Occupational History  . 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 Smoking status: Former Smoker    Packs/day: 1.00    Years: 27.00    Pack years: 27.00    Types: Cigarettes    Start date: 04/07/1991    Last attempt to quit: 03/17/2018    Years since quitting: 0.2  . Smokeless tobacco: Never Used  . Tobacco comment: "I want to" Quit  Substance and Sexual Activity  . Alcohol use: Yes    Alcohol/week: 10.0 standard drinks  Types: 10 Cans of beer per week    Comment: 5, 2Xweek-  04/19/2018- none since last surgery  . Drug use: Yes    Types: Marijuana    Comment: last time -04/17/2018  . Sexual activity: Yes    Partners: Female

## 2018-06-04 ENCOUNTER — Ambulatory Visit (INDEPENDENT_AMBULATORY_CARE_PROVIDER_SITE_OTHER): Payer: Worker's Compensation | Admitting: Physician Assistant

## 2018-06-04 ENCOUNTER — Encounter (INDEPENDENT_AMBULATORY_CARE_PROVIDER_SITE_OTHER): Payer: Self-pay | Admitting: Orthopedic Surgery

## 2018-06-04 ENCOUNTER — Ambulatory Visit (INDEPENDENT_AMBULATORY_CARE_PROVIDER_SITE_OTHER): Payer: Worker's Compensation

## 2018-06-04 VITALS — Ht 76.0 in | Wt 189.0 lb

## 2018-06-04 DIAGNOSIS — M25571 Pain in right ankle and joints of right foot: Secondary | ICD-10-CM

## 2018-06-04 DIAGNOSIS — S82871S Displaced pilon fracture of right tibia, sequela: Secondary | ICD-10-CM

## 2018-06-05 ENCOUNTER — Other Ambulatory Visit (INDEPENDENT_AMBULATORY_CARE_PROVIDER_SITE_OTHER): Payer: Self-pay

## 2018-06-05 ENCOUNTER — Telehealth (INDEPENDENT_AMBULATORY_CARE_PROVIDER_SITE_OTHER): Payer: Self-pay

## 2018-06-05 ENCOUNTER — Encounter (INDEPENDENT_AMBULATORY_CARE_PROVIDER_SITE_OTHER): Payer: Self-pay | Admitting: Physician Assistant

## 2018-06-05 NOTE — Telephone Encounter (Signed)
Faxed the 05/29/18 and 06/04/18 office note to [facility] per her request

## 2018-06-05 NOTE — Telephone Encounter (Signed)
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 Needs this addressed at every appt since he is WC.

## 2018-06-05 NOTE — Progress Notes (Signed)
Office Visit Note   Patient: Lucas Garrett           Date of Birth: [birth year]           MRN: [id number] Visit Date: 06/04/2018              Requested by: No referring provider defined for this encounter. PCP: Patient, No Pcp Per  Chief Complaint  Patient presents with  . Right Ankle - Routine Post Op    04/20/2018 ORIF pilon fx e-fix      HPI: The patient is a 43 year old gentleman who was seen for postoperative follow-up following internal and external fixation for a right ankle Pilon fracture on 04/20/2018.  He presents today for follow-up radiographs and removal of external fixation.  He reports no difficulties in the interim.  Assessment & Plan: Visit Diagnoses:  1. Pilon fracture of right tibia, sequela   2. Pain in right ankle and joints of right foot     Plan: The external fixation was removed by Dr. Lajoyce Corners without difficulty.  Instructed the patient that he may begin showering tomorrow and apply dry dressings to the pin sites.  He is going to be placed in a fracture boot and may begin partial weightbearing to tolerance.  He was also instructed to begin ankle range of motion exercises tracing the alphabet with his foot.  He will follow-up in 2 weeks or sooner should he have difficulties in the interim.  Follow-Up Instructions: No follow-ups on file.   Ortho Exam  Patient is alert, oriented, no adenopathy, well-dressed, normal affect, normal respiratory effort. The right ankle has mild edema about the ankle and foot.  The pin sites are without signs of infection and the pins were removed today here in the office with removal of external fixator and the patient tolerated this well.  There was minimal bleeding following the pin removal.  There is a small ruptured 5 mm blister about the anterior ankle without signs of infection or cellulitis.  The incision over the ankle itself is well-healed and without signs of infection or cellulitis.  He has palpable pedal pulses and the  foot is warm.  Imaging: Xr Ankle Complete Right  Result Date: 06/05/2018 Radiographs of the right ankle show good interval healing with internal fixation in place.  Good alignment of the ankle mortise.  No images are attached to the encounter.  Labs: No results found for: HGBA1C, ESRSEDRATE, CRP, LABURIC, REPTSTATUS, GRAMSTAIN, CULT, LABORGA   Lab Results  Component Value Date   ALBUMIN 3.3 (L) 04/20/2018    Body mass index is 23.01 kg/m.  Orders:  Orders Placed This Encounter  Procedures  . XR Ankle Complete Right   No orders of the defined types were placed in this encounter.    Procedures: No procedures performed  Clinical Data: No additional findings.  ROS:  All other systems negative, except as noted in the HPI. Review of Systems  Objective: Vital Signs: Ht 6\' 4"  (1.93 m)   Wt 189 lb (85.7 kg)   BMI 23.01 kg/m   Specialty Comments:  No specialty comments available.  PMFS History: Patient Active Problem List   Diagnosis Date Noted  . Pilon fracture of right tibia, sequela 04/06/2018   Past Medical History:  Diagnosis Date  . Pneumonia    as a child    History reviewed. No pertinent family history.  Past Surgical History:  Procedure Laterality Date  . BRONCHOSCOPIC REMOVAL OF FOREIGN BODY    .  EXTERNAL FIXATION LEG Right 04/06/2018   Procedure: EXTERNAL FIXATION RIGHT PILON FRACTURE;  Surgeon: Kathryne Hitch, MD;  Location: [facility];  Service: Orthopedics;  Laterality: Right;  . ORIF ANKLE FRACTURE Right 04/20/2018   Procedure: OPEN REDUCTION INTERNAL FIXATION RIGHT PILON FRACTURE AND ADJUST EXTERNAL FIXATOR;  Surgeon: Nadara Mustard, MD;  Location: [facility];  Service: Orthopedics;  Laterality: Right;   Social History   Occupational History  . 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 Smoking status: Former Smoker    Packs/day: 1.00    Years: 27.00    Pack years: 27.00    Types: Cigarettes      Start date: 04/07/1991    Last attempt to quit: 03/17/2018    Years since quitting: 0.2  . Smokeless tobacco: Never Used  . Tobacco comment: "I want to" Quit  Substance and Sexual Activity  . Alcohol use: Yes    Alcohol/week: 10.0 standard drinks    Types: 10 Cans of beer per week    Comment: 5, 2Xweek-  04/19/2018- none since last surgery  . Drug use: Yes    Types: Marijuana    Comment: last time -04/17/2018  . Sexual activity: Yes    Partners: Female

## 2018-06-05 NOTE — Telephone Encounter (Signed)
I faxed a note stating that the pt will remain out of work for the next three months. Of note the pt has never had a case manager accompany him to any of his office visits. Dr. Lajoyce Corners would like for you to relay that if they are needing information at every visit then they should come to the visits with the patient.

## 2018-06-05 NOTE — Telephone Encounter (Signed)
faxed

## 2018-06-18 ENCOUNTER — Encounter (INDEPENDENT_AMBULATORY_CARE_PROVIDER_SITE_OTHER): Payer: Self-pay | Admitting: Orthopedic Surgery

## 2018-06-18 ENCOUNTER — Ambulatory Visit (INDEPENDENT_AMBULATORY_CARE_PROVIDER_SITE_OTHER): Payer: Worker's Compensation | Admitting: Orthopedic Surgery

## 2018-06-18 VITALS — Ht 76.0 in | Wt 189.0 lb

## 2018-06-18 DIAGNOSIS — S82871S Displaced pilon fracture of right tibia, sequela: Secondary | ICD-10-CM

## 2018-06-18 MED ORDER — HYDROCODONE-ACETAMINOPHEN 5-325 MG PO TABS: 1.0000 | ORAL_TABLET | Freq: Four times a day (QID) | ORAL | 0 refills | Status: DC | PRN

## 2018-06-18 NOTE — Progress Notes (Signed)
Office Visit Note   Patient: Lucas Garrett           Date of Birth: 26-Jan-1976           MRN: [id number] Visit Date: 06/18/2018              Requested by: No referring provider defined for this encounter. PCP: Patient, No Pcp Per  Chief Complaint  Patient presents with  . Right Ankle - Routine Post Op    04/20/2018 ORIF       HPI: Patient is a 43 year old gentleman who presents 2 months status post open reduction internal fixation as well as external fixation for pilon fracture right ankle.  Patient states he does have some stiffness and soreness with trying to weight-bear and improve his range of motion.  Assessment & Plan: Visit Diagnoses:  1. Pilon fracture of right tibia, sequela     Plan: Patient was given reinforcement for the stretching exercises we will set him up with physical therapy he is called in a prescription for Vicodin.  3 view radiographs of the right ankle at follow-up.  Follow-Up Instructions: Return in about 3 weeks (around 07/09/2018).   Ortho Exam  Patient is alert, oriented, no adenopathy, well-dressed, normal affect, normal respiratory effort. Examination patient has good wrinkling of the skin there is no redness no cellulitis he has dorsiflexion of about 10 degrees.  His foot is plantigrade no varus or valgus malalignment no cellulitis no signs of infection.  Imaging: No results found. No images are attached to the encounter.  Labs: No results found for: HGBA1C, ESRSEDRATE, CRP, LABURIC, REPTSTATUS, GRAMSTAIN, CULT, LABORGA   Lab Results  Component Value Date   ALBUMIN 3.3 (L) 04/20/2018    Body mass index is 23.01 kg/m.  Orders:  No orders of the defined types were placed in this encounter.  No orders of the defined types were placed in this encounter.    Procedures: No procedures performed  Clinical Data: No additional findings.  ROS:  All other systems negative, except as noted in the HPI. Review of  Systems  Objective: Vital Signs: Ht 6\' 4"  (1.93 m)   Wt 189 lb (85.7 kg)   BMI 23.01 kg/m   Specialty Comments:  No specialty comments available.  PMFS History: Patient Active Problem List   Diagnosis Date Noted  . Pilon fracture of right tibia, sequela 04/06/2018   Past Medical History:  Diagnosis Date  . Pneumonia    as a child    History reviewed. No pertinent family history.  Past Surgical History:  Procedure Laterality Date  . BRONCHOSCOPIC REMOVAL OF FOREIGN BODY    . EXTERNAL FIXATION LEG Right 04/06/2018   Procedure: EXTERNAL FIXATION RIGHT PILON FRACTURE;  Surgeon: Kathryne Hitch, MD;  Location: [facility];  Service: Orthopedics;  Laterality: Right;  . ORIF ANKLE FRACTURE Right 04/20/2018   Procedure: OPEN REDUCTION INTERNAL FIXATION RIGHT PILON FRACTURE AND ADJUST EXTERNAL FIXATOR;  Surgeon: Nadara Mustard, MD;  Location: [facility];  Service: Orthopedics;  Laterality: Right;   Social History   Occupational History  . 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 Smoking status: Former Smoker    Packs/day: 1.00    Years: 27.00    Pack years: 27.00    Types: Cigarettes    Start date: 04/07/1991    Last attempt to quit: 03/17/2018    Years since quitting: 0.2  . Smokeless tobacco: Never Used  .  Tobacco comment: "I want to" Quit  Substance and Sexual Activity  . Alcohol use: Yes    Alcohol/week: 10.0 standard drinks    Types: 10 Cans of beer per week    Comment: 5, 2Xweek-  04/19/2018- none since last surgery  . Drug use: Yes    Types: Marijuana    Comment: last time -04/17/2018  . Sexual activity: Yes    Partners: Female

## 2018-06-20 ENCOUNTER — Ambulatory Visit: Payer: Worker's Compensation | Admitting: Physical Therapy

## 2018-07-09 ENCOUNTER — Other Ambulatory Visit: Payer: Self-pay

## 2018-07-09 ENCOUNTER — Encounter (INDEPENDENT_AMBULATORY_CARE_PROVIDER_SITE_OTHER): Payer: Self-pay | Admitting: Orthopedic Surgery

## 2018-07-09 ENCOUNTER — Ambulatory Visit (INDEPENDENT_AMBULATORY_CARE_PROVIDER_SITE_OTHER): Payer: Worker's Compensation | Admitting: Physician Assistant

## 2018-07-09 ENCOUNTER — Ambulatory Visit (INDEPENDENT_AMBULATORY_CARE_PROVIDER_SITE_OTHER): Payer: Worker's Compensation

## 2018-07-09 VITALS — Ht 76.0 in | Wt 189.0 lb

## 2018-07-09 DIAGNOSIS — S82871S Displaced pilon fracture of right tibia, sequela: Secondary | ICD-10-CM | POA: Diagnosis not present

## 2018-07-09 MED ORDER — DOXYCYCLINE HYCLATE 100 MG PO CAPS: 100.0000 mg | ORAL_CAPSULE | Freq: Two times a day (BID) | ORAL | 1 refills | Status: DC

## 2018-07-09 MED ORDER — HYDROCODONE-ACETAMINOPHEN 5-325 MG PO TABS: 1.0000 | ORAL_TABLET | Freq: Four times a day (QID) | ORAL | 0 refills | Status: DC | PRN

## 2018-07-09 NOTE — Progress Notes (Signed)
Office Visit Note   Patient: Lucas Garrett           Date of Birth: 07-31-1975           MRN: [id number] Visit Date: 07/09/2018              Requested by: No referring provider defined for this encounter. PCP: Patient, No Pcp Per  Chief Complaint  Patient presents with  . Right Ankle - Routine Post Op    04/20/2018 right ankle ORIF and external fixation for pilon fracture.       HPI: The patient is a 43 yo gentleman who is seen for post operative follow up following ORIF of a right ankle pilon fracture on 04/20/2018.  He has been ambulating with his cane in a fracture boot.  He was doing some physical therapy but this is mainly a home program now due to the "19 virus risk.  He reports some occasional clicking and popping.  He is having some increased swelling about the distal tibia just above the ankle joint and there is also some mild erythema.  Assessment & Plan: Visit Diagnoses:  1. Pilon fracture of right tibia, sequela     Plan: We will begin doxycycline 100 mg p.o. twice daily for the next 2 weeks.  We have also placed him in a silver compression sock for edema control.  He can continue his exercises and ambulation in his fracture boot with his cane.  He is can a follow-up here in 2 weeks or sooner should he have difficulties in the interim.  Follow-Up Instructions: Return in about 2 weeks (around 07/23/2018).   Ortho Exam  Patient is alert, oriented, no adenopathy, well-dressed, normal affect, normal respiratory effort. Patient is some edema about his distal tibia just above the ankle joint and some mild erythema which improves with elevation.  There is no increased warmth some mild induration to palpation.  He has palpable pedal pulses.  His ankle motion is slowly improving.  Imaging: No results found. No images are attached to the encounter.  Labs: No results found for: HGBA1C, ESRSEDRATE, CRP, LABURIC, REPTSTATUS, GRAMSTAIN, CULT, LABORGA   Lab Results  Component  Value Date   ALBUMIN 3.3 (L) 04/20/2018    Body mass index is 23.01 kg/m.  Orders:  Orders Placed This Encounter  Procedures  . XR Ankle Complete Right   Meds ordered this encounter  Medications  . HYDROcodone-acetaminophen (NORCO/VICODIN) 5-325 MG tablet    Sig: Take 1 tablet by mouth every 6 (six) hours as needed for moderate pain.    Dispense:  30 tablet    Refill:  0  . doxycycline (VIBRAMYCIN) 100 MG capsule    Sig: Take 1 capsule (100 mg total) by mouth 2 (two) times daily.    Dispense:  28 capsule    Refill:  1     Procedures: No procedures performed  Clinical Data: No additional findings.  ROS:  All other systems negative, except as noted in the HPI. Review of Systems  Objective: Vital Signs: Ht 6\' 4"  (1.93 m)   Wt 189 lb (85.7 kg)   BMI 23.01 kg/m   Specialty Comments:  No specialty comments available.  PMFS History: Patient Active Problem List   Diagnosis Date Noted  . Pilon fracture of right tibia, sequela 04/06/2018   Past Medical History:  Diagnosis Date  . Pneumonia    as a child    History reviewed. No pertinent family history.  Past Surgical  History:  Procedure Laterality Date  . BRONCHOSCOPIC REMOVAL OF FOREIGN BODY    . EXTERNAL FIXATION LEG Right 04/06/2018   Procedure: EXTERNAL FIXATION RIGHT PILON FRACTURE;  Surgeon: Kathryne Hitch, MD;  Location: [facility];  Service: Orthopedics;  Laterality: Right;  . ORIF ANKLE FRACTURE Right 04/20/2018   Procedure: OPEN REDUCTION INTERNAL FIXATION RIGHT PILON FRACTURE AND ADJUST EXTERNAL FIXATOR;  Surgeon: Nadara Mustard, MD;  Location: [facility];  Service: Orthopedics;  Laterality: Right;   Social History   Occupational History  . 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 Smoking status: Former Smoker    Packs/day: 1.00    Years: 27.00    Pack years: 27.00    Types: Cigarettes    Start date: 04/07/1991    Last attempt to quit: 03/17/2018     Years since quitting: 0.3  . Smokeless tobacco: Never Used  . Tobacco comment: "I want to" Quit  Substance and Sexual Activity  . Alcohol use: Yes    Alcohol/week: 10.0 standard drinks    Types: 10 Cans of beer per week    Comment: 5, 2Xweek-  04/19/2018- none since last surgery  . Drug use: Yes    Types: Marijuana    Comment: last time -04/17/2018  . Sexual activity: Yes    Partners: Female

## 2018-07-10 ENCOUNTER — Encounter (INDEPENDENT_AMBULATORY_CARE_PROVIDER_SITE_OTHER): Payer: Self-pay | Admitting: Physician Assistant

## 2018-07-12 ENCOUNTER — Telehealth (INDEPENDENT_AMBULATORY_CARE_PROVIDER_SITE_OTHER): Payer: Self-pay | Admitting: *Deleted

## 2018-07-12 NOTE — Telephone Encounter (Signed)
Cheri with [employer] with workers Comp called stating pt has placed his therapy on hold d/t the covid-19 virus. Pt will call to reschedule.

## 2018-07-19 ENCOUNTER — Telehealth (INDEPENDENT_AMBULATORY_CARE_PROVIDER_SITE_OTHER): Payer: Self-pay | Admitting: Radiology

## 2018-07-19 NOTE — Telephone Encounter (Signed)
Called and spoke with patient, patient answered NO to all pre screening questions for appointment on 4/6 

## 2018-07-23 ENCOUNTER — Encounter (INDEPENDENT_AMBULATORY_CARE_PROVIDER_SITE_OTHER): Payer: Self-pay | Admitting: Orthopedic Surgery

## 2018-07-23 ENCOUNTER — Other Ambulatory Visit: Payer: Self-pay

## 2018-07-23 ENCOUNTER — Ambulatory Visit (INDEPENDENT_AMBULATORY_CARE_PROVIDER_SITE_OTHER): Payer: Worker's Compensation | Admitting: Physician Assistant

## 2018-07-23 DIAGNOSIS — S82871S Displaced pilon fracture of right tibia, sequela: Secondary | ICD-10-CM

## 2018-07-23 MED ORDER — HYDROCODONE-ACETAMINOPHEN 5-325 MG PO TABS: 1.0000 | ORAL_TABLET | Freq: Four times a day (QID) | ORAL | 0 refills | Status: DC | PRN

## 2018-07-23 NOTE — Progress Notes (Signed)
Office Visit Note   Patient: Lucas Garrett           Date of Birth: [birth year]           MRN: [id number] Visit Date: 07/23/2018              Requested by: No referring provider defined for this encounter. PCP: Patient, No Pcp Per  Chief Complaint  Patient presents with  . Right Ankle - Routine Post Op    04/20/18 right ankle ORIF and external fixation pilon fracture.       HPI: The patient is a 43 year old gentleman who is seen for postoperative follow-up of ORIF of his right ankle Pilon fracture on 04/20/2018.  He is ambulating with a fracture boot and a cane.  He is full weightbearing in the fracture boot and does note that he is achy and sore after he walks for longer distances.  He notes continued swelling about the ankle.  He is wearing a silver compression sock.  He has been on doxycycline 100 mg p.o. twice daily.  He continues to have residual swelling over the ankle area.  He does note some popping over the ankle and Dr. 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  Assessment & Plan: Visit Diagnoses:  1. Pilon fracture of right tibia, sequela     Plan: Recommend continued weightbearing in the fracture boot with his cane.  Continue compression stocking and Dr. Lajoyce Corners reinforced with the patient that he will likely need some type of a compression sock for a year or more given the degree of trauma.  Dr. Lajoyce Corners also discussed with the patient that he may need a cleanout with ankle arthroscopy over the next few months once elective cases are resumed. Continue scar massage and range of motion exercises as instructed.  Pain medication was refilled this visit.  He will follow-up here in 2 weeks with follow-up radiographs at that time.  Follow-Up Instructions: Return in about 2 weeks (around 08/06/2018).   Ortho Exam  Patient is alert, oriented, no adenopathy, well-dressed, normal  affect, normal respiratory effort. The incision is continuing to heal well.  He is getting some scar contracture and we discussed continued scar massage.  He has localized swelling about the ankle but his motion overall is slightly improved.  Instructed the patient to continue to work on range of motion exercises.  There is no erythema or other signs of cellulitis or infection today.  He has palpable pedal pulses.  Imaging: No results found. No images are attached to the encounter.  Labs: No results found for: HGBA1C, ESRSEDRATE, CRP, LABURIC, REPTSTATUS, GRAMSTAIN, CULT, LABORGA   Lab Results  Component Value Date   ALBUMIN 3.3 (L) 04/20/2018    Body mass index is 23.01 kg/m.  Orders:  No orders of the defined types were placed in this encounter.  Meds ordered this encounter  Medications  . HYDROcodone-acetaminophen (NORCO/VICODIN) 5-325 MG tablet    Sig: Take 1 tablet by mouth every 6 (six) hours as needed for moderate pain.    Dispense:  30 tablet    Refill:  0     Procedures: No procedures performed  Clinical Data: No additional findings.  ROS:  All other systems negative, except as noted in the HPI. Review of Systems  Objective: Vital Signs: Ht 6\' 4"  (1.93 m)   Wt 189 lb (85.7  kg)   BMI 23.01 kg/m   Specialty Comments:  No specialty comments available.  PMFS History: Patient Active Problem List   Diagnosis Date Noted  . Pilon fracture of right tibia, sequela 04/06/2018   Past Medical History:  Diagnosis Date  . Pneumonia    as a child    History reviewed. No pertinent family history.  Past Surgical History:  Procedure Laterality Date  . BRONCHOSCOPIC REMOVAL OF FOREIGN BODY    . EXTERNAL FIXATION LEG Right 04/06/2018   Procedure: EXTERNAL FIXATION RIGHT PILON FRACTURE;  Surgeon: Kathryne Hitch, MD;  Location: [facility];  Service: Orthopedics;  Laterality: Right;  . ORIF ANKLE FRACTURE Right 04/20/2018   Procedure: OPEN REDUCTION INTERNAL  FIXATION RIGHT PILON FRACTURE AND ADJUST EXTERNAL FIXATOR;  Surgeon: Nadara Mustard, MD;  Location: [facility];  Service: Orthopedics;  Laterality: Right;   Social History   Occupational History  . 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 Smoking status: Former Smoker    Packs/day: 1.00    Years: 27.00    Pack years: 27.00    Types: Cigarettes    Start date: 04/07/1991    Last attempt to quit: 03/17/2018    Years since quitting: 0.3  . Smokeless tobacco: Never Used  . Tobacco comment: "I want to" Quit  Substance and Sexual Activity  . Alcohol use: Yes    Alcohol/week: 10.0 standard drinks    Types: 10 Cans of beer per week    Comment: 5, 2Xweek-  04/19/2018- none since last surgery  . Drug use: Yes    Types: Marijuana    Comment: last time -04/17/2018  . Sexual activity: Yes    Partners: Female

## 2018-07-25 ENCOUNTER — Encounter (INDEPENDENT_AMBULATORY_CARE_PROVIDER_SITE_OTHER): Payer: Self-pay | Admitting: Physician Assistant

## 2018-07-31 ENCOUNTER — Telehealth (INDEPENDENT_AMBULATORY_CARE_PROVIDER_SITE_OTHER): Payer: Self-pay

## 2018-07-31 NOTE — Telephone Encounter (Signed)
Faxed the 07/23/18 office note to [facility] per her request

## 2018-08-06 ENCOUNTER — Ambulatory Visit (INDEPENDENT_AMBULATORY_CARE_PROVIDER_SITE_OTHER): Payer: Worker's Compensation | Admitting: Orthopedic Surgery

## 2018-08-06 ENCOUNTER — Encounter (INDEPENDENT_AMBULATORY_CARE_PROVIDER_SITE_OTHER): Payer: Self-pay | Admitting: Orthopedic Surgery

## 2018-08-06 ENCOUNTER — Other Ambulatory Visit: Payer: Self-pay

## 2018-08-06 ENCOUNTER — Ambulatory Visit (INDEPENDENT_AMBULATORY_CARE_PROVIDER_SITE_OTHER): Payer: Worker's Compensation

## 2018-08-06 VITALS — Ht 76.0 in | Wt 189.0 lb

## 2018-08-06 DIAGNOSIS — M25871 Other specified joint disorders, right ankle and foot: Secondary | ICD-10-CM

## 2018-08-06 DIAGNOSIS — S82871S Displaced pilon fracture of right tibia, sequela: Secondary | ICD-10-CM | POA: Diagnosis not present

## 2018-08-06 MED ORDER — HYDROCODONE-ACETAMINOPHEN 5-325 MG PO TABS: 1.0000 | ORAL_TABLET | Freq: Four times a day (QID) | ORAL | 0 refills | Status: DC | PRN

## 2018-08-06 NOTE — Progress Notes (Signed)
Office Visit Note   Patient: Lucas Garrett           Date of Birth: 29-Apr-1975           MRN: [id number] Visit Date: 08/06/2018              Requested by: No referring provider defined for this encounter. PCP: Patient, No Pcp Per  Chief Complaint  Patient presents with  . Right Ankle - Follow-up    04/20/2018 right ankle ORIF and external fixation pilon fx       HPI: Patient is a 43 year old gentleman who presents almost 3 months status post external fixation followed by internal fixation for a pilon fracture right ankle.  Patient complains of pain with range of motion of the ankle and also complains of limited range of motion.  Patient currently ambulates with a cane in regular shoewear.  He is wearing compression stockings.  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  Assessment & Plan: Visit Diagnoses:  1. Pilon fracture of right tibia, sequela   2. Impingement of right ankle joint     Plan: Plan: We will set him up for arthroscopic debridement for the arthrofibrosis and impingement within the ankle secondary to the trauma.  I think this should significantly improve his symptoms.   Discussed that patient most likely would not be able to return to his previous level of work he would not be safe on ladders or uneven surfaces such as a roof.  Patient was given a refill prescription for his Vicodin.  Follow-Up Instructions: Return in about 1 week (around 08/13/2018).   Ortho Exam  Patient is alert, oriented, no adenopathy, well-dressed, normal affect, normal respiratory effort. Examination patient has no redness no cellulitis.  He only has about 20 degrees of range of motion of the ankle there is pain with plantarflexion and dorsiflexion.  He is tender to palpation over the anterior aspect of the ankle.  Imaging: Xr Ankle Complete Right  Result Date: 08/06/2018 3 view  radiographs of the right ankle shows increased callus formation.  The tibiotalar joint is congruent in both AP and lateral planes.  No joint space collapse no subcondylar sclerosis or lucency.  No images are attached to the encounter.  Labs: No results found for: HGBA1C, ESRSEDRATE, CRP, LABURIC, REPTSTATUS, GRAMSTAIN, CULT, LABORGA   Lab Results  Component Value Date   ALBUMIN 3.3 (L) 04/20/2018    Body mass index is 23.01 kg/m.  Orders:  Orders Placed This Encounter  Procedures  . XR Ankle Complete Right   Meds ordered this encounter  Medications  . HYDROcodone-acetaminophen (NORCO/VICODIN) 5-325 MG tablet    Sig: Take 1 tablet by mouth every 6 (six) hours as needed for moderate pain.    Dispense:  30 tablet    Refill:  0     Procedures: No procedures performed  Clinical Data: No additional findings.  ROS:  All other systems negative, except as noted in the HPI. Review of Systems  Objective: Vital Signs: Ht 6\' 4"  (1.93 m)   Wt 189 lb (85.7 kg)   BMI 23.01 kg/m   Specialty Comments:  No specialty comments available.  PMFS History: Patient Active Problem List   Diagnosis Date Noted  . Pilon fracture of right tibia, sequela 04/06/2018   Past Medical History:  Diagnosis Date  . Pneumonia    as a child  No family history on file.  Past Surgical History:  Procedure Laterality Date  . BRONCHOSCOPIC REMOVAL OF FOREIGN BODY    . EXTERNAL FIXATION LEG Right 04/06/2018   Procedure: EXTERNAL FIXATION RIGHT PILON FRACTURE;  Surgeon: Kathryne HitchBlackman, Christopher Y, MD;  Location: [facility];  Service: Orthopedics;  Laterality: Right;  . ORIF ANKLE FRACTURE Right 04/20/2018   Procedure: OPEN REDUCTION INTERNAL FIXATION RIGHT PILON FRACTURE AND ADJUST EXTERNAL FIXATOR;  Surgeon: Nadara Mustarduda, Marcus V, MD;  Location: [facility];  Service: Orthopedics;  Laterality: Right;   Social History   Occupational History  . 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 Smoking status: Former Smoker    Packs/day: 1.00    Years: 27.00    Pack years: 27.00    Types: Cigarettes    Start date: 04/07/1991    Last attempt to quit: 03/17/2018    Years since quitting: 0.3  . Smokeless tobacco: Never Used  . Tobacco comment: "I want to" Quit  Substance and Sexual Activity  . Alcohol use: Yes    Alcohol/week: 10.0 standard drinks    Types: 10 Cans of beer per week    Comment: 5, 2Xweek-  04/19/2018- none since last surgery  . Drug use: Yes    Types: Marijuana    Comment: last time -04/17/2018  . Sexual activity: Yes    Partners: Female

## 2018-08-08 ENCOUNTER — Telehealth (INDEPENDENT_AMBULATORY_CARE_PROVIDER_SITE_OTHER): Payer: Self-pay

## 2018-08-08 NOTE — Telephone Encounter (Signed)
Faxed the 08/06/18 office note to work comp adj Cordelia Pen and asked her to email me the surgery auth if approved [phone]

## 2018-08-14 NOTE — Telephone Encounter (Signed)
Lucas Garrett, adjuster, has auth right ankle arthroscopic debridement Please notify me of pre-op, surgery, and post op appt info Thanks for your help,  Sloan Leiter, RN, [facility] Nurse Case Manager Phone [phone] Fax [phone]

## 2018-08-29 ENCOUNTER — Telehealth: Payer: Self-pay | Admitting: Orthopedic Surgery

## 2018-08-29 NOTE — Telephone Encounter (Signed)
Pt called asking for pain medication for his lower right ankle

## 2018-08-29 NOTE — Telephone Encounter (Signed)
ORIF right ankle pilon fx 04/20/18 asking for refill on pain medication. Last refill 4/20 #30 4/6 #30 please advise.

## 2018-08-29 NOTE — Telephone Encounter (Signed)
I scheduled Mr. Lucas Garrett at [facility] for Tuesday 09/11/2018 at 10am. Spoke with patient and gave surgery information.  His first post op will be with Denny Peon, NP on Wed 09/19/18 at 9:30am. I left detailed message for Sloan Leiter and advised her to call if she needed any additional information.

## 2018-08-30 ENCOUNTER — Other Ambulatory Visit: Payer: Self-pay | Admitting: Orthopedic Surgery

## 2018-08-30 DIAGNOSIS — S82871S Displaced pilon fracture of right tibia, sequela: Secondary | ICD-10-CM

## 2018-08-30 MED ORDER — HYDROCODONE-ACETAMINOPHEN 5-325 MG PO TABS: 1.0000 | ORAL_TABLET | Freq: Four times a day (QID) | ORAL | 0 refills | Status: AC | PRN

## 2018-08-30 NOTE — Telephone Encounter (Signed)
rx sent to pharmacy

## 2018-08-30 NOTE — Telephone Encounter (Signed)
Noted in referral.

## 2018-08-30 NOTE — Telephone Encounter (Signed)
I called pt and advised that rx has been faxed to pharm. To call with questions.

## 2018-09-04 ENCOUNTER — Other Ambulatory Visit: Payer: Self-pay

## 2018-09-04 ENCOUNTER — Encounter (HOSPITAL_BASED_OUTPATIENT_CLINIC_OR_DEPARTMENT_OTHER): Payer: Self-pay | Admitting: *Deleted

## 2018-09-07 ENCOUNTER — Other Ambulatory Visit (HOSPITAL_COMMUNITY)
Admission: RE | Admit: 2018-09-07 | Discharge: 2018-09-07 | Disposition: A | Discharge status: Home / Self Care | Payer: HRSA Program | Source: Ambulatory Visit | Attending: Orthopedic Surgery | Admitting: Orthopedic Surgery

## 2018-09-07 DIAGNOSIS — Z1159 Encounter for screening for other viral diseases: Secondary | ICD-10-CM | POA: Diagnosis present

## 2018-09-08 LAB — NOVEL CORONAVIRUS, NAA (HOSP ORDER, SEND-OUT TO REF LAB; TAT 18-24 HRS): SARS-CoV-2, NAA: NOT DETECTED

## 2018-09-11 ENCOUNTER — Other Ambulatory Visit: Payer: Self-pay

## 2018-09-11 ENCOUNTER — Ambulatory Visit (HOSPITAL_BASED_OUTPATIENT_CLINIC_OR_DEPARTMENT_OTHER)
Admission: RE | Admit: 2018-09-11 | Discharge: 2018-09-11 | Disposition: A | Discharge status: Home / Self Care | Payer: Worker's Compensation | Attending: Orthopedic Surgery | Admitting: Orthopedic Surgery

## 2018-09-11 ENCOUNTER — Encounter (HOSPITAL_BASED_OUTPATIENT_CLINIC_OR_DEPARTMENT_OTHER)
Admission: RE | Disposition: A | Discharge status: Home / Self Care | Payer: Self-pay | Source: Home / Self Care | Attending: Orthopedic Surgery

## 2018-09-11 ENCOUNTER — Encounter (HOSPITAL_BASED_OUTPATIENT_CLINIC_OR_DEPARTMENT_OTHER): Payer: Self-pay | Admitting: *Deleted

## 2018-09-11 ENCOUNTER — Ambulatory Visit (HOSPITAL_BASED_OUTPATIENT_CLINIC_OR_DEPARTMENT_OTHER): Payer: Worker's Compensation | Admitting: Certified Registered"

## 2018-09-11 DIAGNOSIS — M25871 Other specified joint disorders, right ankle and foot: Secondary | ICD-10-CM | POA: Diagnosis not present

## 2018-09-11 DIAGNOSIS — F17210 Nicotine dependence, cigarettes, uncomplicated: Secondary | ICD-10-CM | POA: Diagnosis not present

## 2018-09-11 DIAGNOSIS — M12571 Traumatic arthropathy, right ankle and foot: Secondary | ICD-10-CM | POA: Insufficient documentation

## 2018-09-11 DIAGNOSIS — S82871S Displaced pilon fracture of right tibia, sequela: Secondary | ICD-10-CM

## 2018-09-11 DIAGNOSIS — X58XXXS Exposure to other specified factors, sequela: Secondary | ICD-10-CM | POA: Diagnosis not present

## 2018-09-11 HISTORY — PX: ANKLE ARTHROSCOPY: SHX545

## 2018-09-11 SURGERY — ARTHROSCOPY, ANKLE
Anesthesia: General | Site: Ankle | Laterality: Right

## 2018-09-11 MED ORDER — LACTATED RINGERS IV SOLN
INTRAVENOUS | Status: DC
  Administered 2018-09-11: 09:00:00 via INTRAVENOUS

## 2018-09-11 MED ORDER — LACTATED RINGERS IV SOLN: INTRAVENOUS | Status: DC

## 2018-09-11 MED ORDER — SODIUM CHLORIDE 0.9 % IR SOLN
Status: DC | PRN
  Administered 2018-09-11: 9000 mL

## 2018-09-11 MED ORDER — ONDANSETRON HCL 4 MG/2ML IJ SOLN
INTRAMUSCULAR | Status: AC
  Filled 2018-09-11: qty 2

## 2018-09-11 MED ORDER — OXYCODONE-ACETAMINOPHEN 5-325 MG PO TABS: 1.0000 | ORAL_TABLET | ORAL | 0 refills | Status: DC | PRN

## 2018-09-11 MED ORDER — DEXAMETHASONE SODIUM PHOSPHATE 10 MG/ML IJ SOLN
INTRAMUSCULAR | Status: AC
  Filled 2018-09-11: qty 1

## 2018-09-11 MED ORDER — FENTANYL CITRATE (PF) 100 MCG/2ML IJ SOLN
INTRAMUSCULAR | Status: AC
  Filled 2018-09-11: qty 2

## 2018-09-11 MED ORDER — METOCLOPRAMIDE HCL 5 MG/ML IJ SOLN: 10.0000 mg | Freq: Once | INTRAMUSCULAR | Status: DC | PRN

## 2018-09-11 MED ORDER — PROPOFOL 10 MG/ML IV BOLUS
INTRAVENOUS | Status: DC | PRN
  Administered 2018-09-11: 200 mg via INTRAVENOUS

## 2018-09-11 MED ORDER — MIDAZOLAM HCL 2 MG/2ML IJ SOLN
1.0000 mg | INTRAMUSCULAR | Status: DC | PRN
  Administered 2018-09-11: 2 mg via INTRAVENOUS

## 2018-09-11 MED ORDER — FENTANYL CITRATE (PF) 100 MCG/2ML IJ SOLN
25.0000 ug | INTRAMUSCULAR | Status: DC | PRN
  Administered 2018-09-11: 11:00:00 50 ug via INTRAVENOUS

## 2018-09-11 MED ORDER — MIDAZOLAM HCL 2 MG/2ML IJ SOLN
INTRAMUSCULAR | Status: AC
  Filled 2018-09-11: qty 2

## 2018-09-11 MED ORDER — FENTANYL CITRATE (PF) 250 MCG/5ML IJ SOLN
INTRAMUSCULAR | Status: DC | PRN
  Administered 2018-09-11 (×2): 50 ug via INTRAVENOUS

## 2018-09-11 MED ORDER — LIDOCAINE 2% (20 MG/ML) 5 ML SYRINGE
INTRAMUSCULAR | Status: DC | PRN
  Administered 2018-09-11: 100 mg via INTRAVENOUS

## 2018-09-11 MED ORDER — MEPERIDINE HCL 25 MG/ML IJ SOLN: 6.2500 mg | INTRAMUSCULAR | Status: DC | PRN

## 2018-09-11 MED ORDER — SCOPOLAMINE 1 MG/3DAYS TD PT72: 1.0000 | MEDICATED_PATCH | Freq: Once | TRANSDERMAL | Status: DC | PRN

## 2018-09-11 MED ORDER — PROPOFOL 10 MG/ML IV BOLUS
INTRAVENOUS | Status: AC
  Filled 2018-09-11: qty 20

## 2018-09-11 MED ORDER — CHLORHEXIDINE GLUCONATE 4 % EX LIQD: 60.0000 mL | Freq: Once | CUTANEOUS | Status: DC

## 2018-09-11 MED ORDER — FENTANYL CITRATE (PF) 100 MCG/2ML IJ SOLN: 50.0000 ug | INTRAMUSCULAR | Status: DC | PRN

## 2018-09-11 MED ORDER — DEXAMETHASONE SODIUM PHOSPHATE 10 MG/ML IJ SOLN
INTRAMUSCULAR | Status: DC | PRN
  Administered 2018-09-11: 5 mg via INTRAVENOUS

## 2018-09-11 MED ORDER — CEFAZOLIN SODIUM-DEXTROSE 2-4 GM/100ML-% IV SOLN
INTRAVENOUS | Status: AC
  Filled 2018-09-11: qty 100

## 2018-09-11 MED ORDER — BUPIVACAINE HCL (PF) 0.25 % IJ SOLN
INTRAMUSCULAR | Status: DC | PRN
  Administered 2018-09-11: 20 mL

## 2018-09-11 MED ORDER — ONDANSETRON HCL 4 MG/2ML IJ SOLN
INTRAMUSCULAR | Status: DC | PRN
  Administered 2018-09-11: 4 mg via INTRAVENOUS

## 2018-09-11 MED ORDER — LIDOCAINE 2% (20 MG/ML) 5 ML SYRINGE
INTRAMUSCULAR | Status: AC
  Filled 2018-09-11: qty 5

## 2018-09-11 MED ORDER — CEFAZOLIN SODIUM-DEXTROSE 2-4 GM/100ML-% IV SOLN
2.0000 g | INTRAVENOUS | Status: AC
  Administered 2018-09-11: 10:00:00 2 g via INTRAVENOUS

## 2018-09-11 SURGICAL SUPPLY — 38 items
BANDAGE ESMARK 6X9 LF (GAUZE/BANDAGES/DRESSINGS) IMPLANT
BNDG CMPR 9X6 STRL LF SNTH (GAUZE/BANDAGES/DRESSINGS)
BNDG COHESIVE 4X5 TAN STRL (GAUZE/BANDAGES/DRESSINGS) ×3 IMPLANT
BNDG COHESIVE 6X5 TAN STRL LF (GAUZE/BANDAGES/DRESSINGS) ×3 IMPLANT
BNDG ESMARK 6X9 LF (GAUZE/BANDAGES/DRESSINGS)
COVER WAND RF STERILE (DRAPES) IMPLANT
DISSECTOR 4.0MM X 13CM (MISCELLANEOUS) ×3 IMPLANT
DRAPE ARTHROSCOPY W/POUCH 90 (DRAPES) ×3 IMPLANT
DRAPE OEC MINIVIEW 54X84 (DRAPES) IMPLANT
DRAPE U-SHAPE 47X51 STRL (DRAPES) ×3 IMPLANT
DRSG EMULSION OIL 3X3 NADH (GAUZE/BANDAGES/DRESSINGS) ×3 IMPLANT
DURAPREP 26ML APPLICATOR (WOUND CARE) ×3 IMPLANT
GAUZE SPONGE 4X4 12PLY STRL (GAUZE/BANDAGES/DRESSINGS) ×3 IMPLANT
GLOVE BIOGEL PI IND STRL 8.5 (GLOVE) ×1 IMPLANT
GLOVE BIOGEL PI INDICATOR 8.5 (GLOVE) ×2
GOWN STRL REUS W/ TWL LRG LVL3 (GOWN DISPOSABLE) ×1 IMPLANT
GOWN STRL REUS W/ TWL XL LVL3 (GOWN DISPOSABLE) ×1 IMPLANT
GOWN STRL REUS W/TWL LRG LVL3 (GOWN DISPOSABLE) ×3
GOWN STRL REUS W/TWL XL LVL3 (GOWN DISPOSABLE) ×3
KNEE WRAP E Z 3 GEL PACK (MISCELLANEOUS) IMPLANT
MANIFOLD NEPTUNE II (INSTRUMENTS) ×2 IMPLANT
NDL SAFETY ECLIPSE 18X1.5 (NEEDLE) ×1 IMPLANT
NEEDLE HYPO 18GX1.5 SHARP (NEEDLE) ×3
PACK ARTHROSCOPY DSU (CUSTOM PROCEDURE TRAY) ×3 IMPLANT
PACK BASIN DAY SURGERY FS (CUSTOM PROCEDURE TRAY) ×3 IMPLANT
PADDING CAST SYN 6 (CAST SUPPLIES) ×2
PADDING CAST SYNTHETIC 6X4 NS (CAST SUPPLIES) IMPLANT
PORT APPOLLO RF 90DEGREE MULTI (SURGICAL WAND) ×2 IMPLANT
STRAP ANKLE FOOT DISTRACTOR (ORTHOPEDIC SUPPLIES) IMPLANT
SUT ETHILON 4 0 PS 2 18 (SUTURE) ×3 IMPLANT
SUT MNCRL AB 3-0 PS2 18 (SUTURE) IMPLANT
SUT VIC AB 2-0 CT1 27 (SUTURE)
SUT VIC AB 2-0 CT1 TAPERPNT 27 (SUTURE) IMPLANT
SYR 5ML LL (SYRINGE) IMPLANT
TAPE STRIPS DRAPE STRL (GAUZE/BANDAGES/DRESSINGS) IMPLANT
TOWEL GREEN STERILE FF (TOWEL DISPOSABLE) ×3 IMPLANT
TUBING ARTHROSCOPY IRRIG 16FT (MISCELLANEOUS) ×3 IMPLANT
WATER STERILE IRR 1000ML POUR (IV SOLUTION) ×3 IMPLANT

## 2018-09-11 NOTE — Op Note (Signed)
09/11/2018  10:34 AM  PATIENT:  Lucas Garrett    PRE-OPERATIVE DIAGNOSIS:  Impingement Right Ankle  POST-OPERATIVE DIAGNOSIS:  Same  PROCEDURE:  RIGHT ANKLE ARTHROSCOPY with extensive debridement.  SURGEON:  Nadara Mustard, MD  PHYSICIAN ASSISTANT:None ANESTHESIA:   General  PREOPERATIVE INDICATIONS:  [name] is a  43 y.o. male with a diagnosis of Impingement Right Ankle who failed conservative measures and elected for surgical management.    The risks benefits and alternatives were discussed with the patient preoperatively including but not limited to the risks of infection, bleeding, nerve injury, cardiopulmonary complications, the need for revision surgery, among others, and the patient was willing to proceed.  OPERATIVE IMPLANTS: None  @ENCIMAGES @  OPERATIVE FINDINGS: All cartilage was peeled off the tibial plafond.  Extensive synovitis blocking range of motion of the ankle.  OPERATIVE PROCEDURE: Patient was brought the operating room and underwent a general anesthetic.  After adequate levels anesthesia were obtained patient's right lower extremity was prepped using DuraPrep draped into a sterile field a timeout was called.  The scope was inserted through the anterior medial portal and anterior lateral working portal was established.  Visualization showed extensive synovitis with the joint not being visible.  The electrical wand was used to debride the synovitis down to the joint.  Once the joint was visualized and the shaver was used to further perform extensive synovectomy anteriorly as well as medial and lateral gutters.  Examination the articular cartilage of the talar dome was essentially intact there was a little bit of fraying of cartilage laterally.  The cartilage on the tibial plafond was degenerative.  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  The cartilage was debrided the joint was smoothed.  Medial and lateral  gutters were also debrided.  After debridement patient had a good 40 degrees range of motion the ankle essentially no range of motion preoperatively.  The instruments were removed the portals were closed using 2-0 nylon the joint was infused with 20 cc of quarter percent Marcaine plain patient was extubated taken the PACU in stable condition and a dry dressing was applied.   DISCHARGE PLANNING:  Antibiotic duration: Preoperative antibiotics only  Weightbearing: Weightbearing as tolerated  Pain medication: Prescription sent to his pharmacy for Percocet  Dressing care/ Wound VAC: Discontinue dressing in 2 days  Ambulatory devices: As needed  Discharge to: Home.  Follow-up: In the office 1 week post operative.

## 2018-09-11 NOTE — Anesthesia Procedure Notes (Signed)
Procedure Name: LMA Insertion Date/Time: 09/11/2018 9:54 AM Performed by: Lucinda Dell, CRNA Pre-anesthesia Checklist: Patient identified, Emergency Drugs available, Suction available and Patient being monitored Patient Re-evaluated:Patient Re-evaluated prior to induction Oxygen Delivery Method: Circle system utilized Preoxygenation: Pre-oxygenation with 100% oxygen Induction Type: IV induction LMA: LMA inserted LMA Size: 4.0 Tube type: Oral Number of attempts: 1 Placement Confirmation: positive ETCO2 and breath sounds checked- equal and bilateral Tube secured with: Tape Dental Injury: Teeth and Oropharynx as per pre-operative assessment

## 2018-09-11 NOTE — Discharge Instructions (Signed)

## 2018-09-11 NOTE — Transfer of Care (Signed)
Immediate Anesthesia Transfer of Care Note  Patient: ROYSTON WALLEN  Procedure(s) Performed: RIGHT ANKLE ARTHROSCOPY (Right Ankle)  Patient Location: PACU  Anesthesia Type:General  Level of Consciousness: sedated and responds to stimulation  Airway & Oxygen Therapy: Patient Spontanous Breathing and Patient connected to face mask oxygen  Post-op Assessment: Report given to RN and Post -op Vital signs reviewed and stable  Post vital signs: Reviewed and stable  Last Vitals:  Vitals Value Taken Time  BP 145/97 09/11/2018 10:31 AM  Temp    Pulse 51 09/11/2018 10:35 AM  Resp 8 09/11/2018 10:35 AM  SpO2 100 % 09/11/2018 10:35 AM  Vitals shown include unvalidated device data.  Last Pain:  Vitals:   09/11/18 0841  TempSrc: Oral  PainSc: 0-No pain         Complications: No apparent anesthesia complications

## 2018-09-11 NOTE — H&P (Signed)
Lucas Garrett is an 43 y.o. male.   Chief Complaint: Traumatic arthritis pain right ankle HPI: Patient is a 43 year old gentleman who presents almost 3 months status post external fixation followed by internal fixation for a pilon fracture right ankle.  Patient complains of pain with range of motion of the ankle and also complains of limited range of motion.  Patient currently ambulates with a cane in regular shoewear.  He is wearing compression stockings.  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  Past Medical History:  Diagnosis Date  . Pneumonia    as a child    Past Surgical History:  Procedure Laterality Date  . BRONCHOSCOPIC REMOVAL OF FOREIGN BODY    . EXTERNAL FIXATION LEG Right 04/06/2018   Procedure: EXTERNAL FIXATION RIGHT PILON FRACTURE;  Surgeon: Kathryne Hitch, MD;  Location: [facility];  Service: Orthopedics;  Laterality: Right;  . ORIF ANKLE FRACTURE Right 04/20/2018   Procedure: OPEN REDUCTION INTERNAL FIXATION RIGHT PILON FRACTURE AND ADJUST EXTERNAL FIXATOR;  Surgeon: Nadara Mustard, MD;  Location: [facility];  Service: Orthopedics;  Laterality: Right;    History reviewed. No pertinent family history. Social History:  reports that he has been smoking cigarettes. He started smoking about 27 years ago. He has a 27.00 pack-year smoking history. He has never used smokeless tobacco. He reports current alcohol use of about 10.0 standard drinks of alcohol per week. He reports current drug use. Drug: Marijuana.  Allergies:  Allergies  Allergen Reactions  . Oxycodone Itching and Other (See Comments)    Dry mouth    No medications prior to admission.    No results found for this or any previous visit (from the past 48 hour(s)). No results found.  Review of Systems  All other systems reviewed and are negative.   Height 6\' 4"  (1.93 m), weight 83.9 kg. Physical Exam   Patient is alert, oriented, no adenopathy, well-dressed, normal affect, normal respiratory effort. Examination patient has no redness no cellulitis.  He only has about 20 degrees of range of motion of the ankle there is pain with plantarflexion and dorsiflexion.  He is tender to palpation over the anterior aspect of the ankle. Assessment/Plan 1. Pilon fracture of right tibia, sequela   2. Impingement of right ankle joint     Plan: Plan: We will set him up for arthroscopic debridement for the arthrofibrosis and impingement within the ankle secondary to the trauma.  I think this should significantly improve his symptoms.   Discussed that patient most likely would not be able to return to his previous level of work he would not be safe on ladders or uneven surfaces such as a roof.   Nadara Mustard, MD 09/11/2018, 6:49 AM

## 2018-09-11 NOTE — Anesthesia Preprocedure Evaluation (Signed)
Anesthesia Evaluation  Patient identified by MRN, date of birth, ID band Patient awake    Reviewed: Allergy & Precautions, NPO status , Patient's Chart, lab work & pertinent test results  Airway Mallampati: II  TM Distance: >3 FB Neck ROM: Full    Dental no notable dental hx.    Pulmonary Current Smoker,    Pulmonary exam normal breath sounds clear to auscultation       Cardiovascular negative cardio ROS Normal cardiovascular exam Rhythm:Regular Rate:Normal     Neuro/Psych negative neurological ROS  negative psych ROS   GI/Hepatic negative GI ROS, Neg liver ROS,   Endo/Other  negative endocrine ROS  Renal/GU negative Renal ROS  negative genitourinary   Musculoskeletal negative musculoskeletal ROS (+)   Abdominal   Peds negative pediatric ROS (+)  Hematology negative hematology ROS (+)   Anesthesia Other Findings   Reproductive/Obstetrics negative OB ROS                             Anesthesia Physical Anesthesia Plan  ASA: II  Anesthesia Plan: General   Post-op Pain Management:    Induction: Intravenous  PONV Risk Score and Plan: 1 and Ondansetron  Airway Management Planned: LMA  Additional Equipment:   Intra-op Plan:   Post-operative Plan:   Informed Consent: I have reviewed the patients History and Physical, chart, labs and discussed the procedure including the risks, benefits and alternatives for the proposed anesthesia with the patient or authorized representative who has indicated his/her understanding and acceptance.     Dental advisory given  Plan Discussed with: CRNA  Anesthesia Plan Comments:         Anesthesia Quick Evaluation

## 2018-09-12 NOTE — Anesthesia Postprocedure Evaluation (Signed)
Anesthesia Post Note  Patient: Lucas Garrett  Procedure(s) Performed: RIGHT ANKLE ARTHROSCOPY (Right Ankle)     Patient location during evaluation: PACU Anesthesia Type: General Level of consciousness: awake and alert Pain management: pain level controlled Vital Signs Assessment: post-procedure vital signs reviewed and stable Respiratory status: spontaneous breathing, nonlabored ventilation, respiratory function stable and patient connected to nasal cannula oxygen Cardiovascular status: blood pressure returned to baseline and stable Postop Assessment: no apparent nausea or vomiting Anesthetic complications: no    Last Vitals:  Vitals:   09/11/18 1115 09/11/18 1200  BP:  120/75  Pulse: (!) 49 (!) 52  Resp: 10 16  Temp:  36.7 C  SpO2: 99% 99%    Last Pain:  Vitals:   09/12/18 0924  TempSrc:   PainSc: 3                  Phillips Grout

## 2018-09-13 ENCOUNTER — Encounter (HOSPITAL_BASED_OUTPATIENT_CLINIC_OR_DEPARTMENT_OTHER): Payer: Self-pay | Admitting: Orthopedic Surgery

## 2018-09-19 ENCOUNTER — Encounter: Payer: Self-pay | Admitting: Family

## 2018-09-19 ENCOUNTER — Ambulatory Visit (INDEPENDENT_AMBULATORY_CARE_PROVIDER_SITE_OTHER): Payer: Worker's Compensation | Admitting: Family

## 2018-09-19 ENCOUNTER — Other Ambulatory Visit: Payer: Self-pay

## 2018-09-19 VITALS — Ht 76.0 in | Wt 184.0 lb

## 2018-09-19 DIAGNOSIS — M25571 Pain in right ankle and joints of right foot: Secondary | ICD-10-CM

## 2018-09-19 NOTE — Progress Notes (Signed)
   Post-Op Visit Note   Patient: Lucas Garrett           Date of Birth: 12-08-1975           MRN: [id number] Visit Date: 09/19/2018 PCP: Patient, No Pcp Per  Chief Complaint:  Chief Complaint  Patient presents with  . Right Ankle - Routine Post Op    09/11/18 right ankle scope     HPI:  HPI  The patient is a 43 year old gentleman who presents today 1 week status post right ankle arthroscopy.  He is been weightbearing as tolerated in postop shoe.  Has no concerns does have some swelling that he is not concerned about states has been ongoing since his pilon fracture.  Is wearing a compression sock today.  Ortho Exam On exam portals are clean and dry sutures are in place.  There is no erythema moderate swelling to the ankle.  Visit Diagnoses:  1. Pain in right ankle and joints of right foot     Plan: Sutures harvested today without incident.  Patient will continue to weight-bear as tolerated discussed increasing his range of motion exercises of his ankle.  He will follow-up in the office in 2 to 3 weeks with Dr. Lajoyce Corners.  Follow-Up Instructions: Return in about 2 weeks (around 10/03/2018).   Imaging: No results found.  Orders:  No orders of the defined types were placed in this encounter.  No orders of the defined types were placed in this encounter.    PMFS History: Patient Active Problem List   Diagnosis Date Noted  . Pilon fracture of right tibia, sequela 04/06/2018   Past Medical History:  Diagnosis Date  . Pneumonia    as a child    History reviewed. No pertinent family history.  Past Surgical History:  Procedure Laterality Date  . ANKLE ARTHROSCOPY Right 09/11/2018   Procedure: RIGHT ANKLE ARTHROSCOPY;  Surgeon: Nadara Mustard, MD;  Location: [facility];  Service: Orthopedics;  Laterality: Right;  . BRONCHOSCOPIC REMOVAL OF FOREIGN BODY    . EXTERNAL FIXATION LEG Right 04/06/2018   Procedure: EXTERNAL FIXATION RIGHT PILON FRACTURE;  Surgeon:  Kathryne Hitch, MD;  Location: [facility];  Service: Orthopedics;  Laterality: Right;  . ORIF ANKLE FRACTURE Right 04/20/2018   Procedure: OPEN REDUCTION INTERNAL FIXATION RIGHT PILON FRACTURE AND ADJUST EXTERNAL FIXATOR;  Surgeon: Nadara Mustard, MD;  Location: [facility];  Service: Orthopedics;  Laterality: Right;   Social History   Occupational History  . 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 Smoking status: Current Every Day Smoker    Packs/day: 1.00    Years: 27.00    Pack years: 27.00    Types: Cigarettes    Start date: 04/07/1991    Last attempt to quit: 03/17/2018    Years since quitting: 0.5  . Smokeless tobacco: Never Used  . Tobacco comment: "I want to" Quit  Substance and Sexual Activity  . Alcohol use: Yes    Alcohol/week: 10.0 standard drinks    Types: 10 Cans of beer per week    Comment: 5, 2Xweek-  04/19/2018- none since last surgery  . Drug use: Yes    Types: Marijuana    Comment: last time -09/10/2018  . Sexual activity: Yes    Partners: Female

## 2018-09-26 ENCOUNTER — Telehealth: Payer: Self-pay

## 2018-09-26 NOTE — Telephone Encounter (Signed)
Will you call

## 2018-09-26 NOTE — Telephone Encounter (Signed)
Work comp needs to know pts current work status from the 09/19/18 office visit

## 2018-09-27 NOTE — Telephone Encounter (Signed)
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 I am not sure where we need to go from here. Does he need a functional capacity exam? Has it been enough time from surgery? Anything else that we can offer to try and help him improve?

## 2018-09-28 NOTE — Telephone Encounter (Signed)
That sounds like a plan. Thank you

## 2018-09-28 NOTE — Telephone Encounter (Signed)
Sherrie the pt's case Freight forwarder for workers comp called in said she received his recent office visit notes but has yet to receive his work note and ask if she could get that faxed [phone]

## 2018-09-28 NOTE — Telephone Encounter (Signed)
I am waiting for Erin to advise what to do. The pt does not have follow up for two more weeks so I guess we can write a work note till then. He did not say at his visit that he was having difficulty but when I spoke with him he advised that he would "never be able to return to working that job again" I am not sure what to do and have not heard back for erin yet and she is the one that saw him last. Do you want me to write a note until his next appt and we can take it from there?

## 2018-09-28 NOTE — Telephone Encounter (Signed)
Faxed work note to [phone]

## 2018-10-04 ENCOUNTER — Ambulatory Visit: Payer: Self-pay | Admitting: Orthopedic Surgery

## 2018-10-08 ENCOUNTER — Encounter: Payer: Self-pay | Admitting: Orthopedic Surgery

## 2018-10-08 ENCOUNTER — Ambulatory Visit (INDEPENDENT_AMBULATORY_CARE_PROVIDER_SITE_OTHER): Payer: Worker's Compensation | Admitting: Orthopedic Surgery

## 2018-10-08 ENCOUNTER — Ambulatory Visit (INDEPENDENT_AMBULATORY_CARE_PROVIDER_SITE_OTHER): Payer: Worker's Compensation

## 2018-10-08 ENCOUNTER — Other Ambulatory Visit: Payer: Self-pay

## 2018-10-08 VITALS — Ht 76.0 in | Wt 184.0 lb

## 2018-10-08 DIAGNOSIS — M25571 Pain in right ankle and joints of right foot: Secondary | ICD-10-CM | POA: Diagnosis not present

## 2018-10-08 DIAGNOSIS — M25871 Other specified joint disorders, right ankle and foot: Secondary | ICD-10-CM

## 2018-10-08 DIAGNOSIS — S82871S Displaced pilon fracture of right tibia, sequela: Secondary | ICD-10-CM

## 2018-10-08 MED ORDER — OXYCODONE-ACETAMINOPHEN 5-325 MG PO TABS: 1.0000 | ORAL_TABLET | ORAL | 0 refills | Status: AC | PRN

## 2018-10-08 NOTE — Addendum Note (Signed)
Addended by: Meridee Score on: 10/08/2018 08:55 AM   Modules accepted: Orders

## 2018-10-08 NOTE — Progress Notes (Signed)
Office Visit Note   Patient: Lucas Garrett           Date of Birth: March 13, 1976           MRN: [id number] Visit Date: 10/08/2018              Requested by: No referring provider defined for this encounter. PCP: Patient, No Pcp Per  Chief Complaint  Patient presents with  . Right Ankle - Routine Post Op    09/11/18 right ankle scope 05/07/18 ORIF pilon fx      HPI: Patient is a 43 year old gentleman who presents status post right ankle arthroscopy for debridement for traumatic arthritis right ankle status post open reduction internal fixation for the pilon fracture.  Patient states he is having less external rotation of his leg while walking.  Assessment & Plan: Visit Diagnoses:  1. Pain in right ankle and joints of right foot   2. Pilon fracture of right tibia, sequela   3. Impingement of right ankle joint     Plan: Will obtain a functional passive evaluation for the patient had a permanent restriction.  Patient is given a note for seated light duty work until [employer] is completed.  Patient is called in a last prescription for pain medicine.  Discussed that at this point he should rely on anti-inflammatories as well as CBD lotion.  Discussed that down the road patient may require ankle fusion.  He will continue to increase his activities as tolerated.  He states he understands his therapy protocol and does not need additional physical therapy.  Follow-Up Instructions: Return if symptoms worsen or fail to improve.   Ortho Exam  Patient is alert, oriented, no adenopathy, well-dressed, normal affect, normal respiratory effort. Examination patient's foot is plantigrade there is no redness no cellulitis no swelling.  He has 10 degrees of dorsiflexion 20 degrees of plantarflexion at the ankle.  Patient does have an antalgic gait.  Imaging: Xr Ankle Complete Right  Result Date: 10/08/2018 3 view radiographs of the right ankle shows stable bony healing no hardware failure the  joint space is narrowing of the tibiotalar joint.  No images are attached to the encounter.  Labs: No results found for: HGBA1C, ESRSEDRATE, CRP, LABURIC, REPTSTATUS, GRAMSTAIN, CULT, LABORGA   Lab Results  Component Value Date   ALBUMIN 3.3 (L) 04/20/2018    Body mass index is 22.4 kg/m.  Orders:  Orders Placed This Encounter  Procedures  . XR Ankle Complete Right  . Ambulatory referral to Physical Therapy   No orders of the defined types were placed in this encounter.    Procedures: No procedures performed  Clinical Data: No additional findings.  ROS:  All other systems negative, except as noted in the HPI. Review of Systems  Objective: Vital Signs: Ht 6\' 4"  (1.93 m)   Wt 184 lb (83.5 kg)   BMI 22.40 kg/m   Specialty Comments:  No specialty comments available.  PMFS History: Patient Active Problem List   Diagnosis Date Noted  . Pilon fracture of right tibia, sequela 04/06/2018   Past Medical History:  Diagnosis Date  . Pneumonia    as a child    History reviewed. No pertinent family history.  Past Surgical History:  Procedure Laterality Date  . ANKLE ARTHROSCOPY Right 09/11/2018   Procedure: RIGHT ANKLE ARTHROSCOPY;  Surgeon: Newt Minion, MD;  Location: [place];  Service: Orthopedics;  Laterality: Right;  . BRONCHOSCOPIC REMOVAL OF FOREIGN BODY    .  EXTERNAL FIXATION LEG Right 04/06/2018   Procedure: EXTERNAL FIXATION RIGHT PILON FRACTURE;  Surgeon: Kathryne HitchBlackman, Christopher Y, MD;  Location: [facility];  Service: Orthopedics;  Laterality: Right;  . ORIF ANKLE FRACTURE Right 04/20/2018   Procedure: OPEN REDUCTION INTERNAL FIXATION RIGHT PILON FRACTURE AND ADJUST EXTERNAL FIXATOR;  Surgeon: Nadara Mustarduda, Emmersen Garraway V, MD;  Location: [facility];  Service: Orthopedics;  Laterality: Right;   Social History   Occupational History  . 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 Smoking status: Current Every Day  Smoker    Packs/day: 1.00    Years: 27.00    Pack years: 27.00    Types: Cigarettes    Start date: 04/07/1991    Last attempt to quit: 03/17/2018    Years since quitting: 0.5  . Smokeless tobacco: Never Used  . Tobacco comment: "I want to" Quit  Substance and Sexual Activity  . Alcohol use: Yes    Alcohol/week: 10.0 standard drinks    Types: 10 Cans of beer per week    Comment: 5, 2Xweek-  04/19/2018- none since last surgery  . Drug use: Yes    Types: Marijuana    Comment: last time -09/10/2018  . Sexual activity: Yes    Partners: Female

## 2018-11-08 ENCOUNTER — Encounter: Payer: Self-pay | Admitting: Orthopedic Surgery

## 2018-11-08 ENCOUNTER — Ambulatory Visit (INDEPENDENT_AMBULATORY_CARE_PROVIDER_SITE_OTHER): Payer: Worker's Compensation | Admitting: Orthopedic Surgery

## 2018-11-08 VITALS — Ht 76.0 in | Wt 184.0 lb

## 2018-11-08 DIAGNOSIS — M25571 Pain in right ankle and joints of right foot: Secondary | ICD-10-CM

## 2018-11-08 DIAGNOSIS — S82871S Displaced pilon fracture of right tibia, sequela: Secondary | ICD-10-CM

## 2018-11-08 DIAGNOSIS — M25871 Other specified joint disorders, right ankle and foot: Secondary | ICD-10-CM

## 2018-11-08 NOTE — Progress Notes (Signed)
Office Visit Note   Patient: Lucas Garrett           Date of Birth: 1975-05-18           MRN: [id number] Visit Date: 11/08/2018              Requested by: No referring provider defined for this encounter. PCP: Patient, No Pcp Per  Chief Complaint  Patient presents with  . Right Ankle - Routine Post Op    09/11/18 right ankle scope 05/07/18 ORIF Pilon fx       HPI: Patient is a 43 year old gentleman who presents in follow-up status post functional capacity evaluation status post open reduction internal fixation for a pilon fracture as well as arthroscopic debridement for impingement.  Patient still has swelling still limps.  Patient states he does not feel safe on uneven terrain.  Assessment & Plan: Visit Diagnoses:  1. Pain in right ankle and joints of right foot   2. Pilon fracture of right tibia, sequela   3. Impingement of right ankle joint     Plan: Patient states that his lawyer is sending him for another functional capacity evaluation.  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  Again discussed with patient's traumatic arthritis he may eventually require an ankle fusion.  Follow-Up Instructions: Return in about 4 weeks (around 12/06/2018).   Ortho Exam  Patient is alert, oriented, no adenopathy, well-dressed, normal affect, normal respiratory effort. Examination patient has good pulses in the right ankle he has decreased range of motion with measured ankle dorsiflexion on the right of 2 degrees plantarflexion on the right of 24 degrees inversion of 15 degrees and eversion of 8 degrees.  This gives him about a 40 to 50% decreased range of motion and subtalar motion and only 10% of his dorsiflexion range of motion and only about 50% of his plantarflexion range of motion.  In reviewing the functional  capacity evaluation it states patient is able to function primarily heavy physical demand category for lifting.  This reflects the patient's upper extremity strength does not reflect patient decreased range of motion of his ankle and subtalar joint, he is unable to function with heavy lifting on uneven terrain.  And with prolonged heavy lifting patients ankle would definitely require early surgical intervention for fusion.  Imaging: No results found. No images are attached to the encounter.  Labs: No results found for: HGBA1C, ESRSEDRATE, CRP, LABURIC, REPTSTATUS, GRAMSTAIN, CULT, LABORGA   Lab Results  Component Value Date   ALBUMIN 3.3 (L) 04/20/2018    No results found for: MG No results found for: VD25OH  No results found for: PREALBUMIN CBC EXTENDED Latest Ref Rng & Units 04/20/2018  WBC 4.0 - 10.5 K/uL 5.8  RBC 4.22 - 5.81 MIL/uL 4.08(L)  HGB 13.0 - 17.0 g/dL 12.6(L)  HCT 39.0 - 52.0 % 38.8(L)  PLT 150 - 400 K/uL 264     Body mass index is 22.4 kg/m.  Orders:  No orders of the defined types were placed in this encounter.  No orders of the defined types were placed in this encounter.    Procedures: No procedures performed  Clinical Data: No additional findings.  ROS:  All other systems negative, except as noted in the HPI. Review of Systems  Objective: Vital Signs: Ht 6'  4" (1.93 m)   Wt 184 lb (83.5 kg)   BMI 22.40 kg/m   Specialty Comments:  No specialty comments available.  PMFS History: Patient Active Problem List   Diagnosis Date Noted  . Pilon fracture of right tibia, sequela 04/06/2018   Past Medical History:  Diagnosis Date  . Pneumonia    as a child    History reviewed. No pertinent family history.  Past Surgical History:  Procedure Laterality Date  . ANKLE ARTHROSCOPY Right 09/11/2018   Procedure: RIGHT ANKLE ARTHROSCOPY;  Surgeon: Newt Minion, MD;  Location: [place];  Service: Orthopedics;  Laterality: Right;  .  BRONCHOSCOPIC REMOVAL OF FOREIGN BODY    . EXTERNAL FIXATION LEG Right 04/06/2018   Procedure: EXTERNAL FIXATION RIGHT PILON FRACTURE;  Surgeon: Mcarthur Rossetti, MD;  Location: [facility];  Service: Orthopedics;  Laterality: Right;  . ORIF ANKLE FRACTURE Right 04/20/2018   Procedure: OPEN REDUCTION INTERNAL FIXATION RIGHT PILON FRACTURE AND ADJUST EXTERNAL FIXATOR;  Surgeon: Newt Minion, MD;  Location: [place];  Service: Orthopedics;  Laterality: Right;   Social History   Occupational History  . 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 Smoking status: Current Every Day Smoker    Packs/day: 1.00    Years: 27.00    Pack years: 27.00    Types: Cigarettes    Start date: 04/07/1991    Last attempt to quit: 03/17/2018    Years since quitting: 0.6  . Smokeless tobacco: Never Used  . Tobacco comment: "I want to" Quit  Substance and Sexual Activity  . Alcohol use: Yes    Alcohol/week: 10.0 standard drinks    Types: 10 Cans of beer per week    Comment: 5, 2Xweek-  04/19/2018- none since last surgery  . Drug use: Yes    Types: Marijuana    Comment: last time -09/10/2018  . Sexual activity: Yes    Partners: Female

## 2018-11-12 ENCOUNTER — Other Ambulatory Visit: Payer: Self-pay

## 2018-11-12 ENCOUNTER — Telehealth: Payer: Self-pay

## 2018-11-12 NOTE — Telephone Encounter (Signed)
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 Do you want work status to remain out of work or work light duty with restrictions on lifting? He is seeking a second FCE.

## 2018-11-12 NOTE — Telephone Encounter (Signed)
Work comp adj needs current work status

## 2018-11-12 NOTE — Telephone Encounter (Signed)
Note written and will fax.

## 2018-11-12 NOTE — Telephone Encounter (Signed)
Patient may work at a seated sedentary level of work without lifting without standing.

## 2018-11-13 ENCOUNTER — Telehealth: Payer: Self-pay

## 2018-11-13 NOTE — Telephone Encounter (Signed)
Faxed the 11/08/18 office note and work note to adj per her request 

## 2018-12-06 ENCOUNTER — Ambulatory Visit: Payer: Self-pay | Admitting: Orthopedic Surgery

## 2018-12-10 ENCOUNTER — Telehealth: Payer: Self-pay

## 2018-12-10 NOTE — Telephone Encounter (Signed)
Received fax from adj requesting the 12/06/18 office note. Faxed back advising that appt was rescheduled to 12/20/18.

## 2018-12-20 ENCOUNTER — Encounter: Payer: Self-pay | Admitting: Orthopedic Surgery

## 2018-12-20 ENCOUNTER — Ambulatory Visit (INDEPENDENT_AMBULATORY_CARE_PROVIDER_SITE_OTHER): Payer: Worker's Compensation | Admitting: Orthopedic Surgery

## 2018-12-20 VITALS — Ht 76.0 in | Wt 184.0 lb

## 2018-12-20 DIAGNOSIS — S82874D Nondisplaced pilon fracture of right tibia, subsequent encounter for closed fracture with routine healing: Secondary | ICD-10-CM | POA: Diagnosis not present

## 2018-12-21 ENCOUNTER — Encounter: Payer: Self-pay | Admitting: Orthopedic Surgery

## 2018-12-21 ENCOUNTER — Telehealth: Payer: Self-pay | Admitting: Orthopedic Surgery

## 2018-12-21 NOTE — Telephone Encounter (Signed)
Case worker name is Judeen Hammans.

## 2018-12-21 NOTE — Telephone Encounter (Signed)
Patient's case manager called. Would like his visit note from yesterday and a work note. Her fax number is [phone]. Her call back number is [phone]. Thanks.

## 2018-12-21 NOTE — Progress Notes (Signed)
Office Visit Note   Patient: Lucas Garrett           Date of Birth: [birth year]           MRN: [id number] Visit Date: 12/20/2018              Requested by: No referring provider defined for this encounter. PCP: Patient, No Pcp Per  Chief Complaint  Patient presents with  . Right Ankle - Follow-up    04/2018 ORIF pilon fx 09/11/18 ankle scope impingement       HPI: Patient is a 43 year old gentleman who presents in follow-up for his right ankle he is status post a pilon fracture and subsequent right ankle arthroscopy for arthrofibrosis.  Patient is obtained a independent medical evaluation with Dr. Victorino DikeHewitt and this is reviewed.  Assessment & Plan: Visit Diagnoses:  1. Closed nondisplaced pilon fracture of right tibia with routine healing, subsequent encounter     Plan: Discussed with the patient I feel he has reached his maximal medical improvement.  In review of the [place] vessel guidelines for permanent partial impairment is permanent partial impairment would be 40% for ankylosis of the ankle and 10% for decreased subtalar motion for a total of 50% impairment rating of the right foot.  Again discussed with patient he will require an eventual fusion of the ankle.  Discussed that in my opinion, with the patient being a high demand person that a ankle fusion would most likely be better than a total ankle arthroplasty.  And would recommend proceeding with an ankle fusion if and when he cannot do the activities of daily living that he would like to.  Discussed that this may be years in the future.  Patient states that he still does not feel safe climbing into a truck or climbing into a skid steer.  Patient is given a permanent restriction for light duty work maximum lifting 15 pounds.  We discussed the patient may be a candidate for vocational rehab.  Follow-Up Instructions: Return if symptoms worsen or fail to improve.   Ortho Exam  Patient is alert, oriented, no  adenopathy, well-dressed, normal affect, normal respiratory effort. Patient has an antalgic gait.  He has dorsiflexion to neutral his foot is plantigrade there are no plantar ulcers or calluses no venous stasis ulcers.  Imaging: No results found. No images are attached to the encounter.  Labs: No results found for: HGBA1C, ESRSEDRATE, CRP, LABURIC, REPTSTATUS, GRAMSTAIN, CULT, LABORGA   Lab Results  Component Value Date   ALBUMIN 3.3 (L) 04/20/2018    No results found for: MG No results found for: VD25OH  No results found for: PREALBUMIN CBC EXTENDED Latest Ref Rng & Units 04/20/2018  WBC 4.0 - 10.5 K/uL 5.8  RBC 4.22 - 5.81 MIL/uL 4.08(L)  HGB 13.0 - 17.0 g/dL 12.6(L)  HCT 39.0 - 52.0 % 38.8(L)  PLT 150 - 400 K/uL 264     Body mass index is 22.4 kg/m.  Orders:  No orders of the defined types were placed in this encounter.  No orders of the defined types were placed in this encounter.    Procedures: No procedures performed  Clinical Data: No additional findings.  ROS:  All other systems negative, except as noted in the HPI. Review of Systems  Objective: Vital Signs: Ht 6\' 4"  (1.93 m)   Wt 184 lb (83.5 kg)   BMI 22.40 kg/m   Specialty Comments:  No specialty comments available.  PMFS History: Patient Active  Problem List   Diagnosis Date Noted  . Pilon fracture of right tibia, sequela 04/06/2018   Past Medical History:  Diagnosis Date  . Pneumonia    as a child    History reviewed. No pertinent family history.  Past Surgical History:  Procedure Laterality Date  . ANKLE ARTHROSCOPY Right 09/11/2018   Procedure: RIGHT ANKLE ARTHROSCOPY;  Surgeon: Newt Minion, MD;  Location: [place];  Service: Orthopedics;  Laterality: Right;  . BRONCHOSCOPIC REMOVAL OF FOREIGN BODY    . EXTERNAL FIXATION LEG Right 04/06/2018   Procedure: EXTERNAL FIXATION RIGHT PILON FRACTURE;  Surgeon: Mcarthur Rossetti, MD;  Location: [facility];  Service:  Orthopedics;  Laterality: Right;  . ORIF ANKLE FRACTURE Right 04/20/2018   Procedure: OPEN REDUCTION INTERNAL FIXATION RIGHT PILON FRACTURE AND ADJUST EXTERNAL FIXATOR;  Surgeon: Newt Minion, MD;  Location: [place];  Service: Orthopedics;  Laterality: Right;   Social History   Occupational History  . 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 Smoking status: Current Every Day Smoker    Packs/day: 1.00    Years: 27.00    Pack years: 27.00    Types: Cigarettes    Start date: 04/07/1991    Last attempt to quit: 03/17/2018    Years since quitting: 0.7  . Smokeless tobacco: Never Used  . Tobacco comment: "I want to" Quit  Substance and Sexual Activity  . Alcohol use: Yes    Alcohol/week: 10.0 standard drinks    Types: 10 Cans of beer per week    Comment: 5, 2Xweek-  04/19/2018- none since last surgery  . Drug use: Yes    Types: Marijuana    Comment: last time -09/10/2018  . Sexual activity: Yes    Partners: Female

## 2018-12-25 ENCOUNTER — Other Ambulatory Visit: Payer: Self-pay

## 2018-12-25 ENCOUNTER — Telehealth: Payer: Self-pay | Admitting: Orthopedic Surgery

## 2018-12-25 NOTE — Telephone Encounter (Signed)
This has been faxed.

## 2018-12-25 NOTE — Telephone Encounter (Signed)
You saw this pt and he has reached MMI. You gave him permanent restrictions for light duty work and no lifting more than 15 lbs. They are asking if there are any restrictions on walking or standing. Please advise.

## 2018-12-25 NOTE — Telephone Encounter (Signed)
Note for permanent restriction of light duty and max lift of 15 lbs faxed to number provided also with the office visit note.

## 2018-12-25 NOTE — Telephone Encounter (Signed)
Maximum standing or walking 2 hours a day

## 2018-12-25 NOTE — Telephone Encounter (Signed)
Case worker Judeen Hammans called. Would like to know if patient has other restrictions. Walking or standing. Her fax number is [phone]. Her call back number is [phone]. Thanks.

## 2018-12-26 ENCOUNTER — Telehealth: Payer: Self-pay

## 2018-12-26 NOTE — Telephone Encounter (Signed)
Faxed the 12/20/18 office note and work notes to adj per her request

## 2019-08-14 IMAGING — CT CT ANKLE*R* W/O CM
3 series · 10 of 33 positions shown, 12 images · non-contrast
Comparison: Radiographs from 04/06/2018

CLINICAL DATA: Jaydrath fracture, status post external fixation.

EXAM:
CT OF THE RIGHT ANKLE WITHOUT CONTRAST
TECHNIQUE: Multidetector CT imaging of the right ankle was performed according
to the standard protocol. Multiplanar CT image reconstructions were
also generated.

[Series 6: axial (person_name) (person_name) · axial · 0.35mm/px · z∈[+305,+382]mm · 2 of 111 slices shown, 3 images]
[im 34/111  soft-tissue]
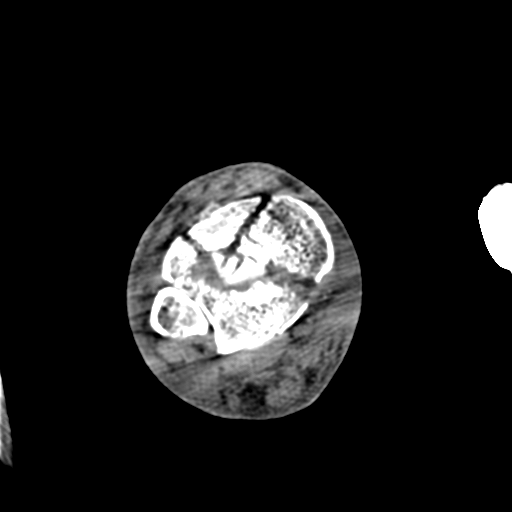
[im 34/111  bone]
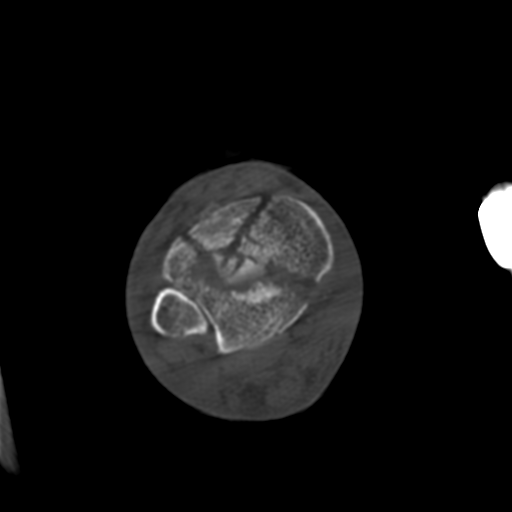
[im 85/111  bone]
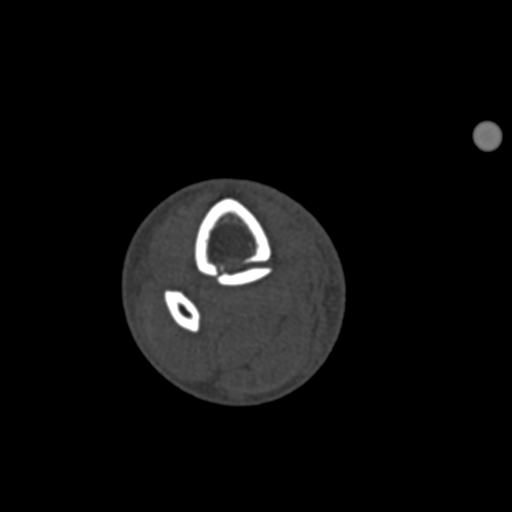

[Series 9: coronal st · coronal · 0.22mm/px · 3 of 77 slices shown]
[im 16/77  bone]
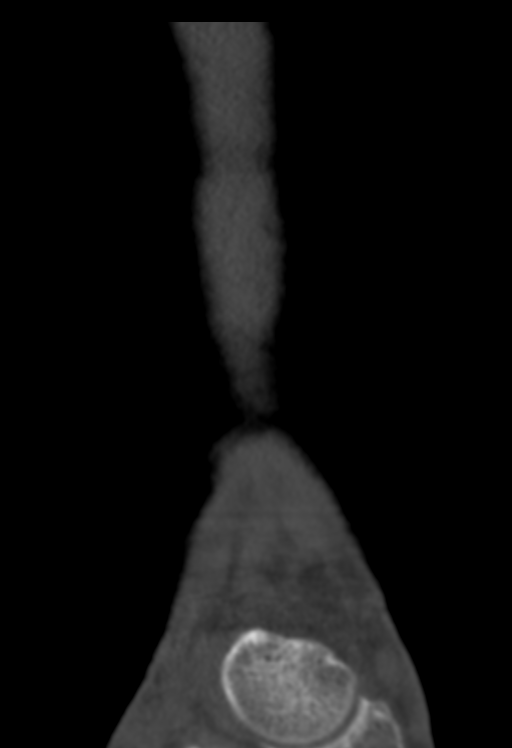
[im 31/77  bone]
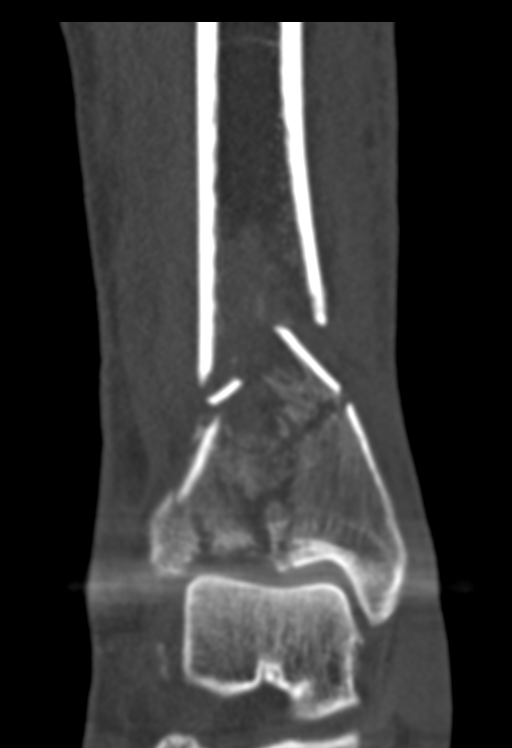
[im 46/77  bone]
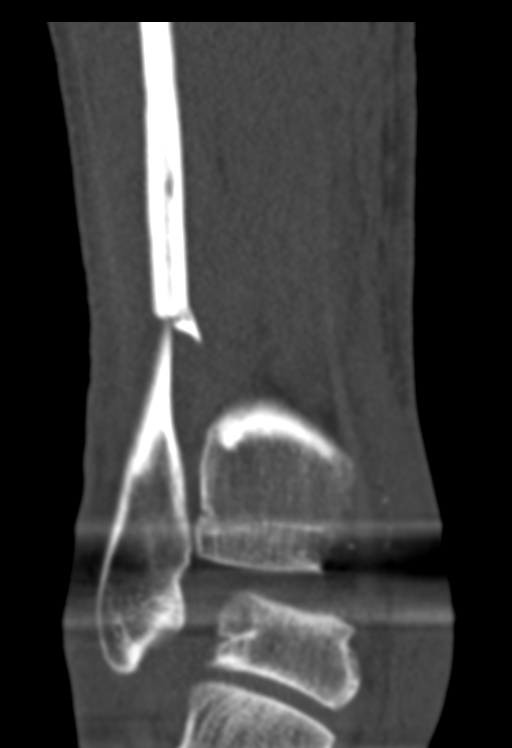

[Series 10: sagittal st · sagittal · 0.21mm/px · 5 of 74 slices shown, 6 images]
[im 25/74  bone]
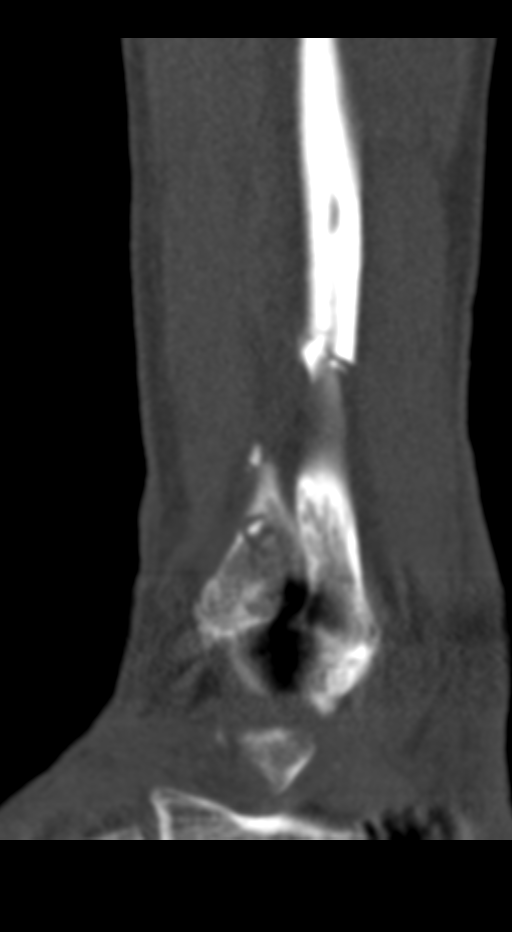
[im 31/74  bone]
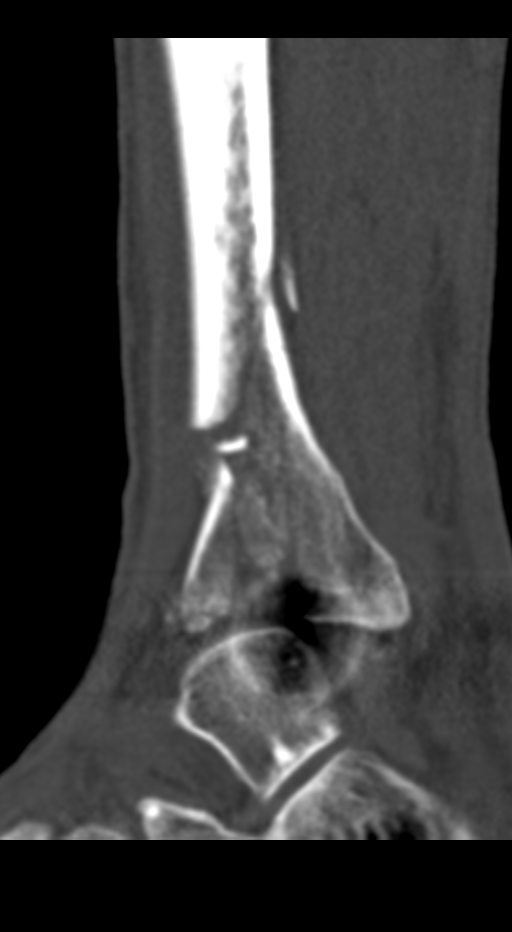
[im 37/74  soft-tissue]
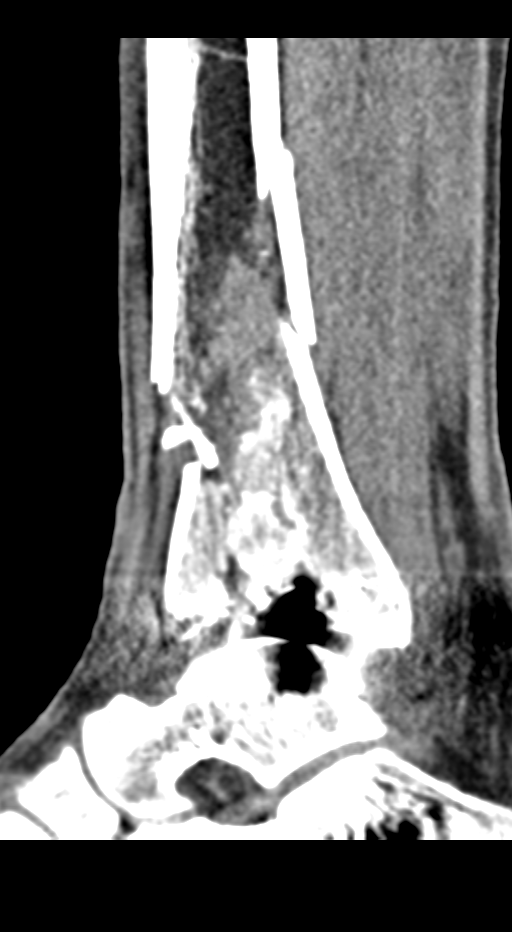
[im 37/74  bone]
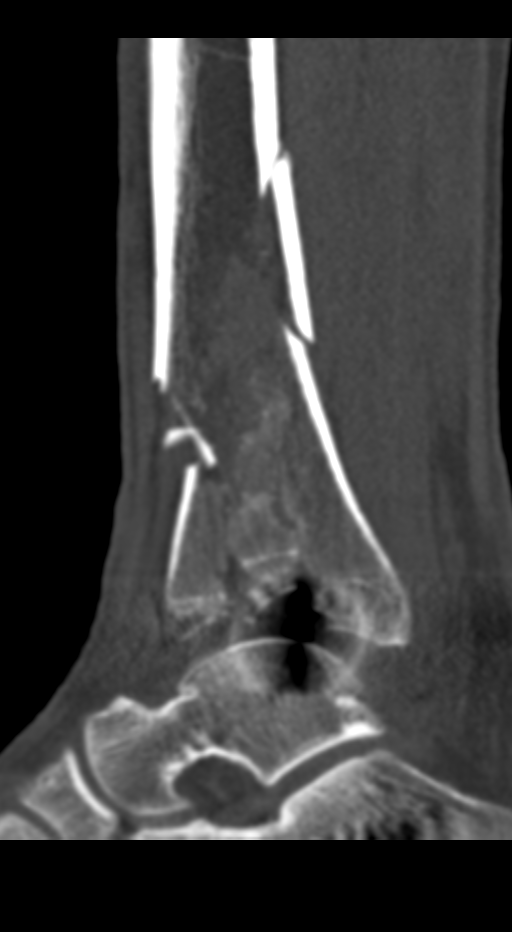
[im 43/74  bone]
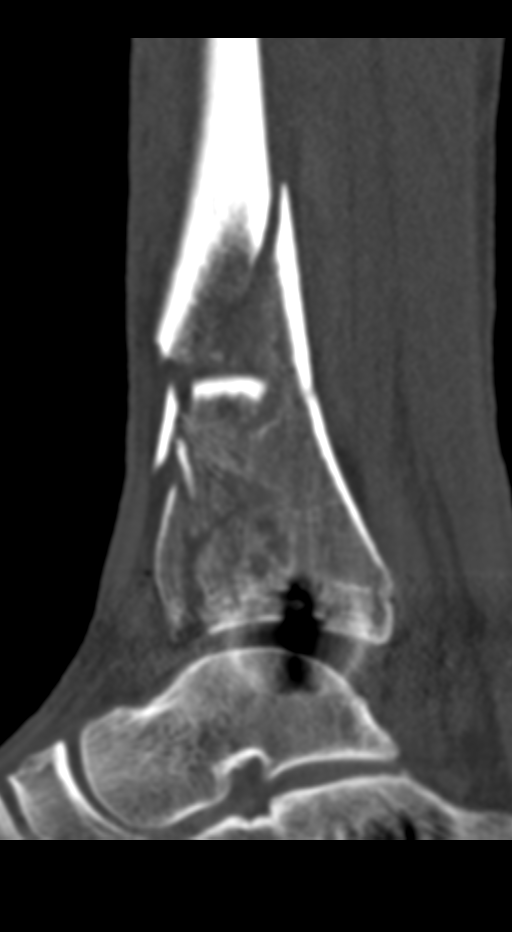
[im 49/74  bone]
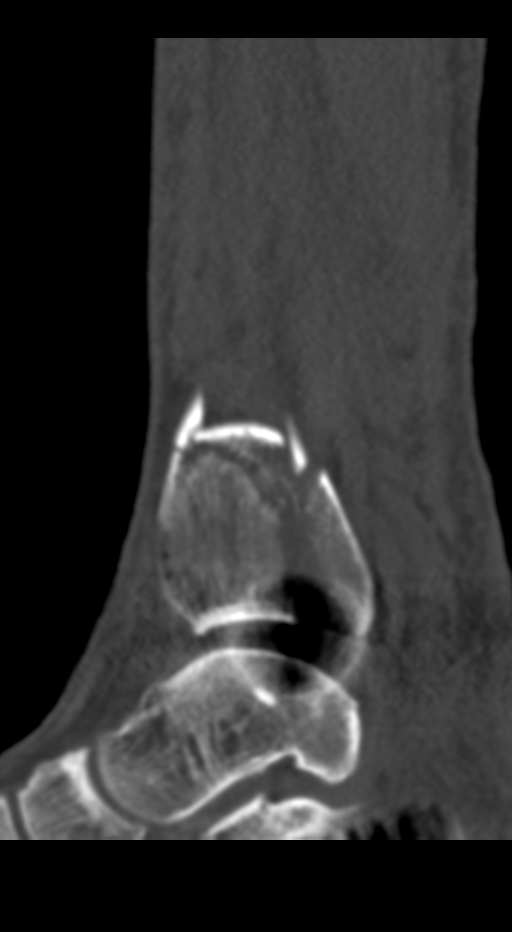

[10 of 33 positions shown; findings below may reference images not displayed]

FINDINGS: Bones/Joint/Cartilage

Type 3 Torres Pereira fracture noted (OTA 43-C3) with extensive comminution
of the intersecting fracture planes along the distal tibial
articular surface and extensive comminution of the extra-articular
components of the distal tibia which extend [DATE] cm from the
tibial articular surface. Among the distal tibial shaft fragments, a
couple of fragments are somewhat imbedded within the fracture
planes, including the 2.3 cm fragment shown medially on image 41/7.
The distal tibial fractures also extend into the distal tibiofibular
joint centrally and anteriorly, and there is widening of the medial
plafond and posteriorly as well as widening of the distance between
the fibula and the talus. There multiple small fragments of bone
along the articular surface of the distal tibia and a couple of tiny
bony fragments loose within the joint. One of the dominant distal
tibial fracture planes is distracted by about 9 mm at the joint as
shown medially on image 52/8.

Mildly comminuted distal fibular shaft fracture observed.

No appreciable fractures of the talus, calcaneus, midfoot, or along
the Lisfranc joint. The metatarsals are all intact. The toes are not
completely included.

External fixator noted extending through the posterior calcaneus.

Ligaments

Suboptimally assessed by CT.

Muscles and Tendons

There is some partial entrapment of the tibialis posterior and
flexor digitorum longus tendons along the distracted posteromedial
fracture plane as shown on consecutive images 57 through 79 of
series 6. Both tendons partially extend into the region of
distraction.

Soft tissues

As expected there is extensive subcutaneous edema. No obvious bony
fragments in the vicinity of the tibial nerve. There are some
fracture planes close to the saphenous nerve along the distal tibia
and close to the intermediate dorsal cutaneous nerve as well.
IMPRESSION: 1. Type 3 Torres Pereira fracture (OTA 43-C3) as described above. Notably,
the tibialis posterior and flexor digitorum longus are both
partially entrapped along the distracted posteromedial fracture
plane of the distal tibia.
